# Patient Record
Sex: Male | Born: 1975 | Race: White | Hispanic: No | Marital: Married | Smoking: Current every day smoker
Health system: Southern US, Community
[De-identification: ages and names within clinical notes are randomized; demographics above are authoritative.]

## PROBLEM LIST (undated history)

## (undated) DIAGNOSIS — I639 Cerebral infarction, unspecified: Secondary | ICD-10-CM

## (undated) DIAGNOSIS — M109 Gout, unspecified: Secondary | ICD-10-CM

## (undated) DIAGNOSIS — R609 Edema, unspecified: Secondary | ICD-10-CM

## (undated) DIAGNOSIS — I10 Essential (primary) hypertension: Secondary | ICD-10-CM

## (undated) DIAGNOSIS — G4730 Sleep apnea, unspecified: Secondary | ICD-10-CM

## (undated) HISTORY — PX: CHOLECYSTECTOMY: SHX55

## (undated) HISTORY — PX: HERNIA REPAIR: SHX51

---

## 2003-09-10 ENCOUNTER — Other Ambulatory Visit: Payer: Self-pay

## 2009-03-19 ENCOUNTER — Emergency Department: Payer: Self-pay | Admitting: Emergency Medicine

## 2012-11-10 ENCOUNTER — Emergency Department: Payer: Self-pay | Admitting: Internal Medicine

## 2012-11-10 LAB — ETHANOL
Ethanol %: <0.003 % (ref 0.000–0.080)
Ethanol: <3 mg/dL

## 2012-11-10 LAB — DRUG SCREEN, URINE
Barbiturates, Ur Screen: NEGATIVE (ref ?–200)
Benzodiazepine, Ur Scrn: NEGATIVE (ref ?–200)
Cannabinoid 50 Ng, Ur ~~LOC~~: POSITIVE (ref ?–50)
Cocaine Metabolite,Ur ~~LOC~~: NEGATIVE (ref ?–300)
MDMA (Ecstasy)Ur Screen: NEGATIVE (ref ?–500)
Opiate, Ur Screen: POSITIVE (ref ?–300)
Phencyclidine (PCP) Ur S: NEGATIVE (ref ?–25)
Tricyclic, Ur Screen: NEGATIVE (ref ?–1000)

## 2012-11-10 LAB — COMPREHENSIVE METABOLIC PANEL
Anion Gap: 4 — ABNORMAL LOW (ref 7–16)
BUN: 18 mg/dL (ref 7–18)
Chloride: 106 mmol/L (ref 98–107)
Co2: 28 mmol/L (ref 21–32)
Creatinine: 1.11 mg/dL (ref 0.60–1.30)
EGFR (African American): >60
EGFR (Non-African Amer.): >60
Glucose: 102 mg/dL — ABNORMAL HIGH (ref 65–99)
Potassium: 4.3 mmol/L (ref 3.5–5.1)
SGOT(AST): 31 U/L (ref 15–37)
Sodium: 138 mmol/L (ref 136–145)
Total Protein: 7.6 g/dL (ref 6.4–8.2)

## 2012-11-10 LAB — CBC
MCH: 31.2 pg (ref 26.0–34.0)
MCHC: 34.7 g/dL (ref 32.0–36.0)
MCV: 90 fL (ref 80–100)
Platelet: 172 10*3/uL (ref 150–440)
RBC: 5.37 10*6/uL (ref 4.40–5.90)
RDW: 13 % (ref 11.5–14.5)

## 2012-11-10 LAB — URINALYSIS, COMPLETE
Bacteria: NONE SEEN
Blood: NEGATIVE
Glucose,UR: NEGATIVE mg/dL (ref 0–75)
Nitrite: NEGATIVE
Protein: NEGATIVE
RBC,UR: NONE SEEN /HPF (ref 0–5)

## 2012-11-10 LAB — TSH: Thyroid Stimulating Horm: 3.07 u[IU]/mL

## 2012-11-13 ENCOUNTER — Emergency Department: Payer: Self-pay | Admitting: Emergency Medicine

## 2012-11-13 LAB — COMPREHENSIVE METABOLIC PANEL
Albumin: 4.6 g/dL (ref 3.4–5.0)
Alkaline Phosphatase: 91 U/L (ref 50–136)
Anion Gap: 7 (ref 7–16)
Calcium, Total: 9.6 mg/dL (ref 8.5–10.1)
Co2: 24 mmol/L (ref 21–32)
Creatinine: 1.08 mg/dL (ref 0.60–1.30)
EGFR (African American): >60
EGFR (Non-African Amer.): >60
SGOT(AST): 20 U/L (ref 15–37)
Total Protein: 7.8 g/dL (ref 6.4–8.2)

## 2012-11-13 LAB — URINALYSIS, COMPLETE
Blood: NEGATIVE
Glucose,UR: NEGATIVE mg/dL (ref 0–75)
Leukocyte Esterase: NEGATIVE
Nitrite: NEGATIVE
Protein: 30
Specific Gravity: 1.03 (ref 1.003–1.030)
Squamous Epithelial: <1
WBC UR: 4 /HPF (ref 0–5)

## 2012-11-13 LAB — DRUG SCREEN, URINE
Barbiturates, Ur Screen: NEGATIVE (ref ?–200)
Benzodiazepine, Ur Scrn: NEGATIVE (ref ?–200)
MDMA (Ecstasy)Ur Screen: NEGATIVE (ref ?–500)
Methadone, Ur Screen: NEGATIVE (ref ?–300)
Opiate, Ur Screen: POSITIVE (ref ?–300)

## 2012-11-13 LAB — CBC
HCT: 50.2 % (ref 40.0–52.0)
HGB: 17.3 g/dL (ref 13.0–18.0)
MCV: 89 fL (ref 80–100)
Platelet: 226 10*3/uL (ref 150–440)
WBC: 11.8 10*3/uL — ABNORMAL HIGH (ref 3.8–10.6)

## 2012-11-13 LAB — LIPASE, BLOOD: Lipase: 89 U/L (ref 73–393)

## 2014-01-15 ENCOUNTER — Emergency Department: Payer: Self-pay | Admitting: Emergency Medicine

## 2014-01-15 LAB — CBC WITH DIFFERENTIAL/PLATELET
BASOS ABS: 0.1 10*3/uL (ref 0.0–0.1)
Basophil %: 0.6 %
EOS ABS: 0 10*3/uL (ref 0.0–0.7)
Eosinophil %: 0.1 %
HCT: 49.9 % (ref 40.0–52.0)
HGB: 16.9 g/dL (ref 13.0–18.0)
LYMPHS ABS: 2.3 10*3/uL (ref 1.0–3.6)
LYMPHS PCT: 16.5 %
MCH: 30.6 pg (ref 26.0–34.0)
MCHC: 33.9 g/dL (ref 32.0–36.0)
MCV: 90 fL (ref 80–100)
MONO ABS: 0.7 x10 3/mm (ref 0.2–1.0)
MONOS PCT: 5.2 %
Neutrophil #: 10.8 10*3/uL — ABNORMAL HIGH (ref 1.4–6.5)
Neutrophil %: 77.6 %
Platelet: 251 10*3/uL (ref 150–440)
RBC: 5.53 10*6/uL (ref 4.40–5.90)
RDW: 13.4 % (ref 11.5–14.5)
WBC: 14 10*3/uL — AB (ref 3.8–10.6)

## 2014-01-15 LAB — URINALYSIS, COMPLETE
BILIRUBIN, UR: NEGATIVE
BLOOD: NEGATIVE
GLUCOSE, UR: NEGATIVE mg/dL (ref 0–75)
LEUKOCYTE ESTERASE: NEGATIVE
Nitrite: NEGATIVE
Ph: 7 (ref 4.5–8.0)
Protein: 30
RBC,UR: 4 /HPF (ref 0–5)
Specific Gravity: 1.024 (ref 1.003–1.030)
Squamous Epithelial: NONE SEEN
WBC UR: 3 /HPF (ref 0–5)

## 2014-01-15 LAB — COMPREHENSIVE METABOLIC PANEL
ANION GAP: 6 — AB (ref 7–16)
Albumin: 3.8 g/dL (ref 3.4–5.0)
Alkaline Phosphatase: 82 U/L
BUN: 14 mg/dL (ref 7–18)
Bilirubin,Total: 1.1 mg/dL — ABNORMAL HIGH (ref 0.2–1.0)
CALCIUM: 8.6 mg/dL (ref 8.5–10.1)
CO2: 31 mmol/L (ref 21–32)
Chloride: 103 mmol/L (ref 98–107)
Creatinine: 1.18 mg/dL (ref 0.60–1.30)
EGFR (African American): >60
EGFR (Non-African Amer.): >60
GLUCOSE: 93 mg/dL (ref 65–99)
Osmolality: 280 (ref 275–301)
Potassium: 3.6 mmol/L (ref 3.5–5.1)
SGOT(AST): 16 U/L (ref 15–37)
SGPT (ALT): 18 U/L
Sodium: 140 mmol/L (ref 136–145)
Total Protein: 7.5 g/dL (ref 6.4–8.2)

## 2014-01-15 LAB — TROPONIN I: Troponin-I: <0.02 ng/mL

## 2014-01-15 LAB — LIPASE, BLOOD: Lipase: 103 U/L (ref 73–393)

## 2014-01-24 ENCOUNTER — Observation Stay: Payer: Self-pay | Admitting: Surgery

## 2014-01-24 LAB — URINALYSIS, COMPLETE
Bilirubin,UR: NEGATIVE
Blood: NEGATIVE
Glucose,UR: NEGATIVE mg/dL (ref 0–75)
LEUKOCYTE ESTERASE: NEGATIVE
Nitrite: NEGATIVE
Ph: 6 (ref 4.5–8.0)
Protein: 30
Specific Gravity: 1.026 (ref 1.003–1.030)

## 2014-01-24 LAB — COMPREHENSIVE METABOLIC PANEL
ALT: 22 U/L
Albumin: 4 g/dL (ref 3.4–5.0)
Alkaline Phosphatase: 85 U/L
Anion Gap: 10 (ref 7–16)
BILIRUBIN TOTAL: 0.8 mg/dL (ref 0.2–1.0)
BUN: 10 mg/dL (ref 7–18)
CALCIUM: 9 mg/dL (ref 8.5–10.1)
CHLORIDE: 106 mmol/L (ref 98–107)
CREATININE: 1.04 mg/dL (ref 0.60–1.30)
Co2: 25 mmol/L (ref 21–32)
EGFR (African American): >60
Glucose: 90 mg/dL (ref 65–99)
Osmolality: 280 (ref 275–301)
Potassium: 3.6 mmol/L (ref 3.5–5.1)
SGOT(AST): 21 U/L (ref 15–37)
Sodium: 141 mmol/L (ref 136–145)
TOTAL PROTEIN: 7.7 g/dL (ref 6.4–8.2)

## 2014-01-24 LAB — CBC WITH DIFFERENTIAL/PLATELET
BASOS ABS: 0.1 10*3/uL (ref 0.0–0.1)
Basophil %: 0.6 %
Eosinophil #: 0.1 10*3/uL (ref 0.0–0.7)
Eosinophil %: 0.5 %
HCT: 52.8 % — ABNORMAL HIGH (ref 40.0–52.0)
HGB: 17.9 g/dL (ref 13.0–18.0)
LYMPHS ABS: 2 10*3/uL (ref 1.0–3.6)
Lymphocyte %: 16.8 %
MCH: 30.8 pg (ref 26.0–34.0)
MCHC: 33.9 g/dL (ref 32.0–36.0)
MCV: 91 fL (ref 80–100)
MONO ABS: 0.5 x10 3/mm (ref 0.2–1.0)
Monocyte %: 4.1 %
NEUTROS PCT: 78 %
Neutrophil #: 9.3 10*3/uL — ABNORMAL HIGH (ref 1.4–6.5)
PLATELETS: 258 10*3/uL (ref 150–440)
RBC: 5.81 10*6/uL (ref 4.40–5.90)
RDW: 13.4 % (ref 11.5–14.5)
WBC: 11.9 10*3/uL — AB (ref 3.8–10.6)

## 2014-01-24 LAB — TROPONIN I: Troponin-I: <0.02 ng/mL

## 2014-01-24 LAB — LIPASE, BLOOD: LIPASE: 116 U/L (ref 73–393)

## 2014-02-27 ENCOUNTER — Emergency Department: Payer: Self-pay | Admitting: Student

## 2014-02-27 LAB — COMPREHENSIVE METABOLIC PANEL
ALK PHOS: 95 U/L
ANION GAP: 9 (ref 7–16)
Albumin: 3.7 g/dL (ref 3.4–5.0)
BILIRUBIN TOTAL: 0.9 mg/dL (ref 0.2–1.0)
BUN: 14 mg/dL (ref 7–18)
CALCIUM: 8.9 mg/dL (ref 8.5–10.1)
CO2: 26 mmol/L (ref 21–32)
Chloride: 105 mmol/L (ref 98–107)
Creatinine: 1.03 mg/dL (ref 0.60–1.30)
EGFR (Non-African Amer.): >60
Glucose: 122 mg/dL — ABNORMAL HIGH (ref 65–99)
Osmolality: 281 (ref 275–301)
Potassium: 3.5 mmol/L (ref 3.5–5.1)
SGOT(AST): 23 U/L (ref 15–37)
SGPT (ALT): 19 U/L
Sodium: 140 mmol/L (ref 136–145)
Total Protein: 7.4 g/dL (ref 6.4–8.2)

## 2014-02-27 LAB — URINALYSIS, COMPLETE
Bacteria: NONE SEEN
Bilirubin,UR: NEGATIVE
Blood: NEGATIVE
GLUCOSE, UR: NEGATIVE mg/dL (ref 0–75)
KETONE: NEGATIVE
Leukocyte Esterase: NEGATIVE
Nitrite: NEGATIVE
Ph: 6 (ref 4.5–8.0)
RBC,UR: <1 /HPF (ref 0–5)
Specific Gravity: 1.028 (ref 1.003–1.030)

## 2014-02-27 LAB — CBC
HCT: 48.2 % (ref 40.0–52.0)
HGB: 16.4 g/dL (ref 13.0–18.0)
MCH: 31.1 pg (ref 26.0–34.0)
MCHC: 34 g/dL (ref 32.0–36.0)
MCV: 91 fL (ref 80–100)
Platelet: 224 10*3/uL (ref 150–440)
RBC: 5.27 10*6/uL (ref 4.40–5.90)
RDW: 13.9 % (ref 11.5–14.5)
WBC: 10.7 10*3/uL — ABNORMAL HIGH (ref 3.8–10.6)

## 2014-02-27 LAB — TROPONIN I

## 2014-02-27 LAB — LIPASE, BLOOD: LIPASE: 57 U/L — AB (ref 73–393)

## 2014-05-27 ENCOUNTER — Emergency Department: Payer: Self-pay | Admitting: Emergency Medicine

## 2014-05-27 LAB — CBC WITH DIFFERENTIAL/PLATELET
Basophil #: 0 10*3/uL (ref 0.0–0.1)
Basophil %: 0.3 %
Eosinophil #: 0 10*3/uL (ref 0.0–0.7)
Eosinophil %: 0 %
HCT: 52.3 % — ABNORMAL HIGH (ref 40.0–52.0)
HGB: 17.5 g/dL (ref 13.0–18.0)
LYMPHS PCT: 6.6 %
Lymphocyte #: 0.9 10*3/uL — ABNORMAL LOW (ref 1.0–3.6)
MCH: 30.5 pg (ref 26.0–34.0)
MCHC: 33.5 g/dL (ref 32.0–36.0)
MCV: 91 fL (ref 80–100)
Monocyte #: 0.3 x10 3/mm (ref 0.2–1.0)
Monocyte %: 1.8 %
Neutrophil #: 13.2 10*3/uL — ABNORMAL HIGH (ref 1.4–6.5)
Neutrophil %: 91.3 %
Platelet: 270 10*3/uL (ref 150–440)
RBC: 5.75 10*6/uL (ref 4.40–5.90)
RDW: 12.9 % (ref 11.5–14.5)
WBC: 14.4 10*3/uL — ABNORMAL HIGH (ref 3.8–10.6)

## 2014-05-27 LAB — URINALYSIS, COMPLETE
BACTERIA: NONE SEEN
BILIRUBIN, UR: NEGATIVE
Blood: NEGATIVE
Glucose,UR: NEGATIVE mg/dL (ref 0–75)
LEUKOCYTE ESTERASE: NEGATIVE
Nitrite: NEGATIVE
PH: 9 (ref 4.5–8.0)
Protein: 100
SQUAMOUS EPITHELIAL: NONE SEEN
Specific Gravity: 1.026 (ref 1.003–1.030)
WBC UR: 2 /HPF (ref 0–5)

## 2014-05-27 LAB — BASIC METABOLIC PANEL
ANION GAP: 9 (ref 7–16)
BUN: 15 mg/dL
CHLORIDE: 104 mmol/L
Calcium, Total: 9.7 mg/dL
Co2: 25 mmol/L
Creatinine: 0.96 mg/dL
EGFR (Non-African Amer.): >60
Glucose: 133 mg/dL — ABNORMAL HIGH
POTASSIUM: 3.9 mmol/L
SODIUM: 138 mmol/L

## 2014-05-27 LAB — LIPASE, BLOOD: Lipase: 18 U/L — ABNORMAL LOW

## 2014-06-24 NOTE — H&P (Signed)
History of Present Illness 39 yom who has had intermittent RUQ pain for over a year, but this has been getting progressively worse over the last few weeks. His pain typically lasts 1/2 - 2 hours, but lately his pain has lasted all day. He is always nauseous and usually vomits, as he has done today. No fever, chills, or jaundice Sx.   Past History MO   Past Med/Surgical Hx:  Gallstones:   HTN:   Denies medical history:   Hernia Repair:   ALLERGIES:  No Known Allergies:   HOME MEDICATIONS: Medication Instructions Status  promethazine 25 mg oral tablet 1 tab(s) orally every 6 hours, As Needed - for Nausea, Vomiting Active   Family and Social History:  Social History refer to my outpatient H & P   Review of Systems:  Fever/Chills No   Cough No   Sputum No   Abdominal Pain Yes   Diarrhea No   Constipation No   Nausea/Vomiting Yes   SOB/DOE No   Chest Pain No   Dysuria No   Tolerating PT Yes   Tolerating Diet No  Nauseated  Vomiting   Physical Exam:  GEN well developed, well nourished, no acute distress, obese   HEENT pink conjunctivae, PERRL, hearing intact to voice, moist oral mucosa, Oropharynx clear   NECK supple  trachea midline   RESP normal resp effort  clear BS  no use of accessory muscles   CARD regular rate  no murmur  no JVD  no Rub   ABD denies tenderness  obese   EXTR negative cyanosis/clubbing, negative edema   SKIN normal to palpation, No rashes, skin turgor good   NEURO cranial nerves intact, negative tremor, follows commands, motor/sensory function intact   PSYCH alert, A+O to time, place, person, poor insight   Lab Results: Hepatic:  24-Nov-15 20:16   Bilirubin, Total 0.8  Alkaline Phosphatase 85 (46-116 NOTE: New Reference Range 09/20/13)  SGPT (ALT) 22 (14-63 NOTE: New Reference Range 09/20/13)  SGOT (AST) 21  Total Protein, Serum 7.7  Albumin, Serum 4.0  Routine Chem:  24-Nov-15 20:16   Glucose, Serum 90  BUN 10   Creatinine (comp) 1.04  Sodium, Serum 141  Potassium, Serum 3.6  Chloride, Serum 106  CO2, Serum 25  Calcium (Total), Serum 9.0  Osmolality (calc) 280  eGFR (African American) >60  eGFR (Non-African American) >60 (eGFR values <66m/min/1.73 m2 may be an indication of chronic kidney disease (CKD). Calculated eGFR, using the MRDR Study equation, is useful in  patients with stable renal function. The eGFR calculation will not be reliable in acutely ill patients when serum creatinine is changing rapidly. It is not useful in patients on dialysis. The eGFR calculation may not be applicable to patients at the low and high extremes of body sizes, pregnant women, and vegetarians.)  Anion Gap 10  Lipase 116 (Result(s) reported on 24 Jan 2014 at 09:35PM.)  Cardiac:  24-Nov-15 20:16   Troponin I < 0.02 (0.00-0.05 0.05 ng/mL or less: NEGATIVE  Repeat testing in 3-6 hrs  if clinically indicated. >0.05 ng/mL: POTENTIAL  MYOCARDIAL INJURY. Repeat  testing in 3-6 hrs if  clinically indicated. NOTE: An increase or decrease  of 30% or more on serial  testing suggests a  clinically important change)  Routine UA:  24-Nov-15 20:16   Color (UA) Yellow  Clarity (UA) Clear  Glucose (UA) Negative  Bilirubin (UA) Negative  Ketones (UA) 1+  Specific Gravity (UA) 1.026  Blood (UA) Negative  pH (UA) 6.0  Protein (UA) 30 mg/dL  Nitrite (UA) Negative  Leukocyte Esterase (UA) Negative (Result(s) reported on 24 Jan 2014 at 09:35PM.)  RBC (UA) 3 /HPF  WBC (UA) 1 /HPF  Bacteria (UA) TRACE  Epithelial Cells (UA) <1 /HPF  Mucous (UA) PRESENT (Result(s) reported on 24 Jan 2014 at 09:35PM.)  Routine Hem:  24-Nov-15 20:16   WBC (CBC)  11.9  RBC (CBC) 5.81  Hemoglobin (CBC) 17.9  Hematocrit (CBC)  52.8  Platelet Count (CBC) 258  MCV 91  MCH 30.8  MCHC 33.9  RDW 13.4  Neutrophil % 78.0  Lymphocyte % 16.8  Monocyte % 4.1  Eosinophil % 0.5  Basophil % 0.6  Neutrophil #  9.3  Lymphocyte # 2.0   Monocyte # 0.5  Eosinophil # 0.1  Basophil # 0.1 (Result(s) reported on 24 Jan 2014 at 09:33PM.)   Radiology Results: [place]:    15-Nov-15 23:44, US Abdomen Limited Survey  US Abdomen Limited Survey  REASON FOR EXAM:    Abdominal Pain, N/V  COMMENTS:   Body Site: Gallbladder, Liver, Common Bile Duct    PROCEDURE: [place]  - US ABDOMEN LIMITED SURVEY  - Jan 15 2014 11:44PM     CLINICAL DATA:  Abdominal pain, nausea and vomiting, history of  cholelithiasis    EXAM:  US ABDOMEN LIMITED - RIGHT UPPER QUADRANT    COMPARISON:  None.    FINDINGS:  Gallbladder:  Gallbladder is contracted with multiple gallstones within. No  pericholecystic fluid is seen.    Common bile duct:    Diameter: 6 mm.  This is within normal limits    Liver:    No focal lesion identified. Within normal limits in parenchymal  echogenicity.     IMPRESSION:  Cholelithiasis without complicating factors.  Electronically Signed    By: Inez Catalina M.D.    On: 01/15/2014 23:48         Verified By: Everlene Farrier, M.D.,  LabUnknown:  PACS Image    Assessment/Admission Diagnosis Sx cholelithiasis, possible early acute cholecystitis   Plan Lap CCY in AM Pt understands 1/200 risk of CBD injury an dwishes to proceed.   Electronic Signatures: Consuela Mimes (MD)  (Signed [phone] 21:58)  Authored: CHIEF COMPLAINT and HISTORY, PAST MEDICAL/SURGIAL HISTORY, ALLERGIES, HOME MEDICATIONS, FAMILY AND SOCIAL HISTORY, REVIEW OF SYSTEMS, PHYSICAL EXAM, LABS, Radiology, ASSESSMENT AND PLAN   Last Updated: 24-Nov-15 21:58 by Consuela Mimes (MD)

## 2014-06-24 NOTE — Op Note (Signed)
PATIENT NAME:  Joseph Waters, Joseph Waters MR#:  [id number] DATE OF BIRTH:  10-Sep-1975  DATE OF PROCEDURE:  01/25/2014  PREOPERATIVE DIAGNOSIS:  Symptomatic cholelithiasis.   POSTOPERATIVE DIAGNOSIS:  Symptomatic cholelithiasis.   PROCEDURE PERFORMED: Laparoscopic cholecystectomy.   ANESTHESIA: General.   ESTIMATED BLOOD LOSS: 10 mL.   COMPLICATIONS: None.   SPECIMENS: Gallbladder.   INDICATION FOR SURGERY: [name] is a pleasant 39 year old who presents with recurrent right upper quadrant pain which had acutely worsened prior to when he was supposed to have a laparoscopic cholecystectomy. He was noted to have gallstones and pain was consistent with biliary disease. He was thus brought to the operating room suite for laparoscopic cholecystectomy.   DETAILS OF PROCEDURE:  Informed consent was obtained. [name] was brought to the operating room suite. He was induced. Endotracheal tube was placed. General anesthesia was administered. His abdomen was prepped and draped in standard surgical fashion. A timeout was then performed correctly identifying patient name, operative site, and procedure to perform. A supraumbilical incision was made, it was deepened down to fascia. The fascia was incised. Peritoneum was entered. Two stay stitches were placed through the fasciotomy. A Hassan trocar was placed in the abdomen. The abdomen was insufflated.  An 11 mm scope was placed in the abdomen. An epigastric 11 mm and two 5 mm right subcostal trocars were placed in the midclavicular and anterior axillary line. The gallbladder was then lifted over the dome of the liver, peritoneum was taken down over the gallbladder. Cystic duct and cystic artery were dissected out. Critical view was obtained. These structures were clipped and ligated. The gallbladder was then taken off the gallbladder fossa with cautery and brought through an EndoCatch bag. The abdomen was irrigated. Hemostasis was obtained and when happy with hemostasis  all trocars were removed. Abdomen was desufflated. The supraumbilical fascia was closed with figure-of-eight 0 Vicryl. The skin was then closed with interrupted 4-0 Monocryl deep dermal sutures. Steri-Strips, Telfa, gauze, and Tegaderm were used to complete the dressing. The patient was then awoken, extubated, and brought to the postanesthesia care unit. There were no immediate complications. Needle, sponge, and instrument counts were correct at the end of the procedure.    ____________________________ Si Raiderhristopher A. Clifford Coudriet, MD cal:bu D: 01/25/2014 14:00:00 ET T: 01/25/2014 17:52:20 ET JOB#: [id number]  cc: Cristal Deerhristopher A. Thomasina Housley, MD, <Dictator> Jarvis NewcomerHRISTOPHER A Camay Pedigo MD ELECTRONICALLY SIGNED 02/06/2014 20:08

## 2014-06-26 LAB — SURGICAL PATHOLOGY

## 2014-12-13 ENCOUNTER — Encounter: Payer: Self-pay | Admitting: Emergency Medicine

## 2014-12-13 ENCOUNTER — Emergency Department
Admission: EM | Admit: 2014-12-13 | Discharge: 2014-12-13 | Disposition: A | Discharge status: Home / Self Care | Payer: Self-pay | Attending: Emergency Medicine | Admitting: Emergency Medicine

## 2014-12-13 DIAGNOSIS — R109 Unspecified abdominal pain: Secondary | ICD-10-CM

## 2014-12-13 DIAGNOSIS — Z720 Tobacco use: Secondary | ICD-10-CM | POA: Insufficient documentation

## 2014-12-13 DIAGNOSIS — K529 Noninfective gastroenteritis and colitis, unspecified: Secondary | ICD-10-CM | POA: Insufficient documentation

## 2014-12-13 LAB — COMPREHENSIVE METABOLIC PANEL
ALT: 16 U/L — ABNORMAL LOW (ref 17–63)
AST: 19 U/L (ref 15–41)
Albumin: 4.1 g/dL (ref 3.5–5.0)
Alkaline Phosphatase: 66 U/L (ref 38–126)
Anion gap: 7 (ref 5–15)
BUN: 18 mg/dL (ref 6–20)
CHLORIDE: 106 mmol/L (ref 101–111)
CO2: 28 mmol/L (ref 22–32)
Calcium: 8.7 mg/dL — ABNORMAL LOW (ref 8.9–10.3)
Creatinine, Ser: 1.06 mg/dL (ref 0.61–1.24)
GFR calc Af Amer: >60 mL/min (ref >60)
GFR calc non Af Amer: >60 mL/min (ref >60)
Glucose, Bld: 111 mg/dL — ABNORMAL HIGH (ref 65–99)
POTASSIUM: 4.1 mmol/L (ref 3.5–5.1)
SODIUM: 141 mmol/L (ref 135–145)
Total Bilirubin: 1 mg/dL (ref 0.3–1.2)
Total Protein: 7.1 g/dL (ref 6.5–8.1)

## 2014-12-13 LAB — CBC WITH DIFFERENTIAL/PLATELET
Basophils Absolute: 0 10*3/uL (ref 0–0.1)
Basophils Relative: 1 %
EOS ABS: 0.2 10*3/uL (ref 0–0.7)
Eosinophils Relative: 2 %
HEMATOCRIT: 48.8 % (ref 40.0–52.0)
HEMOGLOBIN: 16.3 g/dL (ref 13.0–18.0)
Lymphocytes Relative: 16 %
Lymphs Abs: 1.7 10*3/uL (ref 1.0–3.6)
MCH: 31.5 pg (ref 26.0–34.0)
MCHC: 33.5 g/dL (ref 32.0–36.0)
MCV: 94.1 fL (ref 80.0–100.0)
MONO ABS: 0.6 10*3/uL (ref 0.2–1.0)
MONOS PCT: 5 %
NEUTROS ABS: 8 10*3/uL — AB (ref 1.4–6.5)
NEUTROS PCT: 76 %
Platelets: 200 10*3/uL (ref 150–440)
RBC: 5.19 MIL/uL (ref 4.40–5.90)
RDW: 12.7 % (ref 11.5–14.5)
WBC: 10.5 10*3/uL (ref 3.8–10.6)

## 2014-12-13 LAB — LIPASE, BLOOD: Lipase: 18 U/L — ABNORMAL LOW (ref 22–51)

## 2014-12-13 LAB — ETHANOL: Alcohol, Ethyl (B): <5 mg/dL (ref <5)

## 2014-12-13 MED ORDER — ONDANSETRON HCL 4 MG PO TABS: 4.0000 mg | ORAL_TABLET | Freq: Three times a day (TID) | ORAL | Status: DC | PRN

## 2014-12-13 MED ORDER — GI COCKTAIL ~~LOC~~
30.0000 mL | Freq: Once | ORAL | Status: AC
  Administered 2014-12-13: 30 mL via ORAL
  Filled 2014-12-13: qty 30

## 2014-12-13 MED ORDER — SODIUM CHLORIDE 0.9 % IV BOLUS (SEPSIS)
1000.0000 mL | Freq: Once | INTRAVENOUS | Status: AC
  Administered 2014-12-13: 1000 mL via INTRAVENOUS

## 2014-12-13 MED ORDER — DICYCLOMINE HCL 10 MG/ML IM SOLN
20.0000 mg | Freq: Once | INTRAMUSCULAR | Status: AC
  Administered 2014-12-13: 20 mg via INTRAMUSCULAR
  Filled 2014-12-13: qty 2

## 2014-12-13 MED ORDER — ONDANSETRON HCL 4 MG/2ML IJ SOLN
4.0000 mg | Freq: Once | INTRAMUSCULAR | Status: AC
  Administered 2014-12-13: 4 mg via INTRAVENOUS
  Filled 2014-12-13: qty 2

## 2014-12-13 NOTE — ED Notes (Signed)
Patient denies taking any medications since onset of symptoms.

## 2014-12-13 NOTE — ED Notes (Signed)
Says pain in upper abd since yest am.  Nausea and vomited couple times.  crampy

## 2014-12-13 NOTE — ED Notes (Signed)
Patient given saltine crackers and ginger ale for PO challenge.  Patient tolerated with no complaints of nausea or vomiting.

## 2014-12-13 NOTE — ED Provider Notes (Addendum)
[facility] Emergency Department Provider Note  ____________________________________________   I have reviewed the triage vital signs and the nursing notes.   HISTORY  Chief Complaint Abdominal Pain    HPI Joseph Waters is a 39 y.o. male with a history of cholecystectomy about a year ago, otherwise fairly healthy, does drink 1-2 beers most nights, presents today complaining of epigastric pain. Patient states that he had vomiting yesterday morning and multiple episodes of watery diarrhea and since that time he has had ongoing diarrhea and cramping abdominal pain especially epigastric region. He has had no fever no chills. He has not vomited since yesterday morning. He has not had by mouth this morning. However he did have some soup yesterday. Food seemed to make the pain worse. He denied any melena, bright red blood per rectum or hematemesis. He states that the pain as a crampy discomfort. It is sharp, comes and goes. The patient states that he has had no lower abdominal discomfort. No recent travel no recent antibiotics or recent camping  History reviewed. No pertinent past medical history.  There are no active problems to display for this patient.   No past surgical history on file.  No current outpatient prescriptions on file.  Allergies Review of patient's allergies indicates no known allergies.  No family history on file.  Social History Social History  Substance Use Topics  . Smoking status: Current Every Day Smoker  . Smokeless tobacco: None  . Alcohol Use: Yes    Review of Systems Constitutional: No fever/chills Eyes: No visual changes. ENT: No sore throat. No stiff neck no neck pain Cardiovascular: Denies chest pain. Respiratory: Denies shortness of breath. Gastrointestinal: See history of present illness Genitourinary: Negative for dysuria. Musculoskeletal: Negative lower extremity swelling Skin: Negative for rash. Neurological:  Negative for headaches, focal weakness or numbness. 10-point ROS otherwise negative.  ____________________________________________   PHYSICAL EXAM:  VITAL SIGNS: ED Triage Vitals  Enc Vitals Group     BP 12/13/14 0749 161/89 mmHg     Pulse Rate 12/13/14 0749 84     Resp 12/13/14 0749 16     Temp 12/13/14 0749 97.3 F (36.3 C)     Temp Source 12/13/14 0749 Oral     SpO2 12/13/14 0749 99 %     Weight --      Height --      Head Cir --      Peak Flow --      Pain Score 12/13/14 0744 0     Pain Loc --      Pain Edu? --      Excl. in GC? --     Constitutional: Alert and oriented. Well appearing and in no acute distress. Eyes: Conjunctivae are normal. PERRL. EOMI. Head: Atraumatic. Nose: No congestion/rhinnorhea. Mouth/Throat: Mucous membranes are moist.  Oropharynx non-erythematous. Neck: No stridor.   Nontender with no meningismus Cardiovascular: Normal rate, regular rhythm. Grossly normal heart sounds.  Good peripheral circulation. Respiratory: Normal respiratory effort.  No retractions. Lungs CTAB. Gastrointestinal: Patient is morbidly obese. There is minimal epigastric discomfort with no guarding or rebound, there is no right upper quadrant right lower quadrant and left lower quadrant or other discomfort noted. Abdomen is soft with good bowel sounds Back:  There is no focal tenderness or step off there is no midline tenderness there are no lesions noted. there is no CVA tenderness Musculoskeletal: No lower extremity tenderness. No joint effusions, no DVT signs strong distal pulses no edema Neurologic:  Normal speech and language. No gross focal neurologic deficits are appreciated.  Skin:  Skin is warm, dry and intact. No rash noted. Psychiatric: Mood and affect are normal. Speech and behavior are normal.  ____________________________________________   LABS (all labs ordered are listed, but only abnormal results are displayed)  Labs Reviewed  COMPREHENSIVE METABOLIC  PANEL  ETHANOL  CBC WITH DIFFERENTIAL/PLATELET  LIPASE, BLOOD   ____________________________________________  EKG   ____________________________________________  RADIOLOGY   ____________________________________________   PROCEDURES  Procedure(s) performed: None  Critical Care performed: None  ____________________________________________   INITIAL IMPRESSION / ASSESSMENT AND PLAN / ED COURSE  Pertinent labs & imaging results that were available during my care of the patient were reviewed by me and considered in my medical decision making (see chart for details).  Patient presents today complaining of epigastric discomfort, nausea vomiting diarrhea. He does not have a gallbladder. He is very well-appearing. Does not appear dehydrated. No evidence of GI bleed on exam. Vital signs are reassuring. Do not feel this is referred cardiac pain do not feel this is likely a AAA do not feel this is ischemia. Patient's reproducible abdominal pain in the context of nausea vomiting diarrhea. We'll give him IV fluids check for pancreatitis and serial abdominal exams while treating his nausea was pain and reassess. ____________________________________________   ----------------------------------------- 9:17 AM on 12/13/2014 -----------------------------------------  Patient states the GI cocktail helped him but he still is having waves of cramping pain every once in a while. We will continue with hydration, blood work as expected is very reassuring, vital signs are reassuring, serial abdominal exams betray no evidence at this time of any surgical pathology.  ----------------------------------------- 10:13 AM on 12/13/2014 -----------------------------------------  Patient remains at this time pain-free, we'll continue hydration and reassess, serial abdominal exams betray no significant abdominal tenderness specifically no right upper quadrant tenderness no right lower quadrant tenderness.  Strong distal pulses again nothing to suggest AAA, patient would prefer to avoid imaging at this point I think that is not unreasonable.  ----------------------------------------- 10:57 AM on 12/13/2014 -----------------------------------------  Patient remains in no acute distress he states his pain is nearly gone, serial abdominal exams are unremarkable is no evidence of obstruction or internal hernia AAA appendicitis diverticulitis or other acute pathology today. However, extensive return precautions have been given and understood by the patient. The patient continues to decline imaging which I do not think is unreasonable. There is no evidence of acute pathology today. Blood work and vital signs are reassuring. Patient is been able to eat and drink and has had no vomiting or he has been here.  FINAL CLINICAL IMPRESSION(S) / ED DIAGNOSES  Final diagnoses:  None     Jeanmarie PlantJames A Taveon Enyeart, MD 12/13/14 [id number]  Jeanmarie PlantJames A Jenisse Vullo, MD 12/13/14 1013  Jeanmarie PlantJames A Jabreel Chimento, MD 12/13/14 1057

## 2014-12-13 NOTE — Discharge Instructions (Signed)

## 2015-08-29 ENCOUNTER — Emergency Department
Admission: EM | Admit: 2015-08-29 | Discharge: 2015-08-29 | Disposition: A | Discharge status: Home / Self Care | Payer: Self-pay | Attending: Student | Admitting: Student

## 2015-08-29 ENCOUNTER — Emergency Department: Payer: Self-pay

## 2015-08-29 DIAGNOSIS — M1711 Unilateral primary osteoarthritis, right knee: Secondary | ICD-10-CM | POA: Insufficient documentation

## 2015-08-29 DIAGNOSIS — F17200 Nicotine dependence, unspecified, uncomplicated: Secondary | ICD-10-CM | POA: Insufficient documentation

## 2015-08-29 DIAGNOSIS — I10 Essential (primary) hypertension: Secondary | ICD-10-CM | POA: Insufficient documentation

## 2015-08-29 DIAGNOSIS — Z79899 Other long term (current) drug therapy: Secondary | ICD-10-CM | POA: Insufficient documentation

## 2015-08-29 HISTORY — DX: Essential (primary) hypertension: I10

## 2015-08-29 MED ORDER — IBUPROFEN 800 MG PO TABS: 800.0000 mg | ORAL_TABLET | Freq: Three times a day (TID) | ORAL | Status: DC | PRN

## 2015-08-29 MED ORDER — PREDNISONE 10 MG PO TABS: 50.0000 mg | ORAL_TABLET | Freq: Every day | ORAL | Status: DC

## 2015-08-29 NOTE — Discharge Instructions (Signed)
Heat Therapy Heat therapy can help ease sore, stiff, injured, and tight muscles and joints. Heat relaxes your muscles, which may help ease your pain.  RISKS AND COMPLICATIONS If you have any of the following conditions, do not use heat therapy unless your health care provider has approved:  Poor circulation.  Healing wounds or scarred skin in the area being treated.  Diabetes, heart disease, or high blood pressure.  Not being able to feel (numbness) the area being treated.  Unusual swelling of the area being treated.  Active infections.  Blood clots.  Cancer.  Inability to communicate pain. This may include young children and people who have problems with their brain function (dementia).  Pregnancy. Heat therapy should only be used on old, pre-existing, or long-lasting (chronic) injuries. Do not use heat therapy on new injuries unless directed by your health care provider. HOW TO USE HEAT THERAPY There are several different kinds of heat therapy, including:  Moist heat pack.  Warm water bath.  Hot water bottle.  Electric heating pad.  Heated gel pack.  Heated wrap.  Electric heating pad. Use the heat therapy method suggested by your health care provider. Follow your health care provider's instructions on when and how to use heat therapy. GENERAL HEAT THERAPY RECOMMENDATIONS  Do not sleep while using heat therapy. Only use heat therapy while you are awake.  Your skin may turn pink while using heat therapy. Do not use heat therapy if your skin turns red.  Do not use heat therapy if you have new pain.  High heat or long exposure to heat can cause burns. Be careful when using heat therapy to avoid burning your skin.  Do not use heat therapy on areas of your skin that are already irritated, such as with a rash or sunburn. SEEK MEDICAL CARE IF:  You have blisters, redness, swelling, or numbness.  You have new pain.  Your pain is worse. MAKE SURE  YOU:  Understand these instructions.  Will watch your condition.  Will get help right away if you are not doing well or get worse.   This information is not intended to replace advice given to you by your health care provider. Make sure you discuss any questions you have with your health care provider.   Document Released: 05/12/2011 Document Revised: 03/10/2014 Document Reviewed: 04/12/2013 Elsevier Interactive Patient Education 2016 [employer]  Arthritis Arthritis is a term that is commonly used to refer to joint pain or joint disease. There are more than 100 types of arthritis. CAUSES The most common cause of this condition is wear and tear of a joint. Other causes include:  Gout.  Inflammation of a joint.  An infection of a joint.  Sprains and other injuries near the joint.  A drug reaction or allergic reaction. In some cases, the cause may not be known. SYMPTOMS The main symptom of this condition is pain in the joint with movement. Other symptoms include:  Redness, swelling, or stiffness at a joint.  Warmth coming from the joint.  Fever.  Overall feeling of illness. DIAGNOSIS This condition may be diagnosed with a physical exam and tests, including:  Blood tests.  Urine tests.  Imaging tests, such as MRI, X-rays, or a CT scan. Sometimes, fluid is removed from a joint for testing. TREATMENT Treatment for this condition may involve:  Treatment of the cause, if it is known.  Rest.  Raising (elevating) the joint.  Applying cold or hot packs to the joint.  Medicines  to improve symptoms and reduce inflammation.  Injections of a steroid such as cortisone into the joint to help reduce pain and inflammation. Depending on the cause of your arthritis, you may need to make lifestyle changes to reduce stress on your joint. These changes may include exercising more and losing weight. HOME CARE INSTRUCTIONS Medicines  Take over-the-counter and prescription  medicines only as told by your health care provider.  Do not take aspirin to relieve pain if gout is suspected. Activities  Rest your joint if told by your health care provider. Rest is important when your disease is active and your joint feels painful, swollen, or stiff.  Avoid activities that make the pain worse. It is important to balance activity with rest.  Exercise your joint regularly with range-of-motion exercises as told by your health care provider. Try doing low-impact exercise, such as:  Swimming.  Water aerobics.  Biking.  Walking. Joint Care  If your joint is swollen, keep it elevated if told by your health care provider.  If your joint feels stiff in the morning, try taking a warm shower.  If directed, apply heat to the joint. If you have diabetes, do not apply heat without permission from your health care provider.  Put a towel between the joint and the hot pack or heating pad.  Leave the heat on the area for 20-30 minutes.  If directed, apply ice to the joint:  Put ice in a plastic bag.  Place a towel between your skin and the bag.  Leave the ice on for 20 minutes, 2-3 times per day.  Keep all follow-up visits as told by your health care provider. This is important. SEEK MEDICAL CARE IF:  The pain gets worse.  You have a fever. SEEK IMMEDIATE MEDICAL CARE IF:  You develop severe joint pain, swelling, or redness.  Many joints become painful and swollen.  You develop severe back pain.  You develop severe weakness in your leg.  You cannot control your bladder or bowels.   This information is not intended to replace advice given to you by your health care provider. Make sure you discuss any questions you have with your health care provider.   Document Released: 03/27/2004 Document Revised: 11/08/2014 Document Reviewed: 05/15/2014 Elsevier Interactive Patient Education [employer] Elsevier Inc.

## 2015-08-29 NOTE — ED Notes (Signed)
Pt c/o right knee pain for >1 month.. States it will swell intermittently.

## 2015-08-29 NOTE — ED Provider Notes (Signed)
[facility] Emergency Department Provider Note  ____________________________________________  Time seen: Approximately 10:54 AM  I have reviewed the triage vital signs and the nursing notes.   HISTORY  Chief Complaint Knee Pain    HPI Joseph PigeonCharlie Szalkowski is a 40 y.o. male who presents for evaluation of right knee pain for greater than 1 month. Patient states that intermittently it was swollen a burning sensation on his inner thigh. Denies any trauma. 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 Describes pain as 8/10. No relief with over-the-counter medications.   Past Medical History  Diagnosis Date  . Hypertension     There are no active problems to display for this patient.   History reviewed. No pertinent past surgical history.  Current Outpatient Rx  Name  Route  Sig  Dispense  Refill  . ibuprofen (ADVIL,MOTRIN) 800 MG tablet   Oral   Take 1 tablet (800 mg total) by mouth every 8 (eight) hours as needed.   30 tablet   0   . ondansetron (ZOFRAN) 4 MG tablet   Oral   Take 1 tablet (4 mg total) by mouth every 8 (eight) hours as needed for nausea or vomiting.   8 tablet   0   . predniSONE (DELTASONE) 10 MG tablet   Oral   Take 5 tablets (50 mg total) by mouth daily with breakfast.   25 tablet   0     Allergies Review of patient's allergies indicates no known allergies.  No family history on file.  Social History Social History  Substance Use Topics  . Smoking status: Current Every Day Smoker  . Smokeless tobacco: None  . Alcohol Use: Yes    Review of Systems Constitutional: No fever/chills Cardiovascular: Denies chest pain. Respiratory: Denies shortness of breath. Musculoskeletal: Positive for right knee pain. Skin: Negative for rash. Neurological: Negative for headaches, focal weakness or numbness.  10-point ROS otherwise negative.  ____________________________________________   PHYSICAL EXAM:  VITAL  SIGNS: ED Triage Vitals  Enc Vitals Group     BP 08/29/15 1044 169/108 mmHg     Pulse Rate 08/29/15 1044 80     Resp 08/29/15 1044 18     Temp 08/29/15 1044 98.2 F (36.8 C)     Temp Source 08/29/15 1044 Oral     SpO2 08/29/15 1044 99 %     Weight 08/29/15 1044 400 lb (181.439 kg)     Height 08/29/15 1044 6' (1.829 m)     Head Cir --      Peak Flow --      Pain Score --      Pain Loc --      Pain Edu? --      Excl. in GC? --     Constitutional: Alert and oriented. Well appearing and in no acute distress.  Cardiovascular: Normal rate, regular rhythm. Grossly normal heart sounds.  Good peripheral circulation. Respiratory: Normal respiratory effort.  No retractions. Lungs CTAB. Musculoskeletal: No lower extremity tenderness nor edema.  No joint effusions. Neurologic:  Normal speech and language. No gross focal neurologic deficits are appreciated. No gait instability. Skin:  Skin is warm, dry and intact. No rash noted. Psychiatric: Mood and affect are normal. Speech and behavior are normal.  ____________________________________________   LABS (all labs ordered are listed, but only abnormal results are displayed)  Labs Reviewed - No data to display ____________________________________________  EKG   ____________________________________________  RADIOLOGY  Positive degenerative changes  ____________________________________________   PROCEDURES  Procedure(s) performed: None  Critical Care performed: No  ____________________________________________   INITIAL IMPRESSION / ASSESSMENT AND PLAN / ED COURSE  Pertinent labs & imaging results that were available during my care of the patient were reviewed by me and considered in my medical decision making (see chart for details).  Degenerative changes to the right knee. Given for prednisone 50 mg daily 5 days, ibuprofen 800 mg 3 times a day. Patient follow-up with PCP or return to the ER with any worsening symptomology.  Referral given to orthopedics on call as needed for follow-up of current to review his care as needed. ____________________________________________   FINAL CLINICAL IMPRESSION(S) / ED DIAGNOSES  Final diagnoses:  Osteoarthritis of right knee, unspecified osteoarthritis type     This chart was dictated using voice recognition software/Dragon. Despite best efforts to proofread, errors can occur which can change the meaning. Any change was purely unintentional.   Evangeline Dakinharles M Beers, PA-C 08/29/15 1227  Gayla DossEryka A Gayle, MD 08/29/15 [phone]

## 2016-08-19 ENCOUNTER — Emergency Department
Admission: EM | Admit: 2016-08-19 | Discharge: 2016-08-19 | Disposition: A | Discharge status: Home / Self Care | Payer: Self-pay | Attending: Emergency Medicine | Admitting: Emergency Medicine

## 2016-08-19 ENCOUNTER — Emergency Department: Payer: Self-pay

## 2016-08-19 DIAGNOSIS — M25461 Effusion, right knee: Secondary | ICD-10-CM | POA: Insufficient documentation

## 2016-08-19 DIAGNOSIS — I10 Essential (primary) hypertension: Secondary | ICD-10-CM | POA: Insufficient documentation

## 2016-08-19 DIAGNOSIS — B070 Plantar wart: Secondary | ICD-10-CM | POA: Insufficient documentation

## 2016-08-19 DIAGNOSIS — Z79899 Other long term (current) drug therapy: Secondary | ICD-10-CM | POA: Insufficient documentation

## 2016-08-19 DIAGNOSIS — F17200 Nicotine dependence, unspecified, uncomplicated: Secondary | ICD-10-CM | POA: Insufficient documentation

## 2016-08-19 NOTE — ED Provider Notes (Signed)
[facility] Emergency Department Provider Note   ____________________________________________   First MD Initiated Contact with Patient 08/19/16 [phone]     (approximate)  I have reviewed the triage vital signs and the nursing notes.   HISTORY  Chief Complaint Leg Pain    HPI Joseph Waters is a 41 y.o. male patient complaining of 1 week of right lower leg swelling. Patient denies injury. Patient denies any redness or warmth to the area. Patient stated with required prolonged standing outside. Patient rates the pain as a 4/10. Patient described a pain as "achy". Patient stated pain increases with flexion of the knee. No palliative measures for complaint.   Past Medical History:  Diagnosis Date  . Hypertension     There are no active problems to display for this patient.   History reviewed. No pertinent surgical history.  Prior to Admission medications   Medication Sig Start Date End Date Taking? Authorizing Provider  ibuprofen (ADVIL,MOTRIN) 800 MG tablet Take 1 tablet (800 mg total) by mouth every 8 (eight) hours as needed. 08/29/15   Beers, Charmayne Sheerharles M, PA-C  ondansetron (ZOFRAN) 4 MG tablet Take 1 tablet (4 mg total) by mouth every 8 (eight) hours as needed for nausea or vomiting. 12/13/14   Jeanmarie PlantMcShane, James A, MD  predniSONE (DELTASONE) 10 MG tablet Take 5 tablets (50 mg total) by mouth daily with breakfast. 08/29/15   Beers, Charmayne Sheerharles M, PA-C    Allergies Patient has no known allergies.  No family history on file.  Social History Social History  Substance Use Topics  . Smoking status: Current Every Day Smoker  . Smokeless tobacco: Never Used  . Alcohol use Yes    Review of Systems  Constitutional: No fever/chills. Morbid obesity Eyes: No visual changes. ENT: No sore throat. Cardiovascular: Denies chest pain. Respiratory: Denies shortness of breath. Gastrointestinal: No abdominal pain.  No nausea, no vomiting.  No diarrhea.  No  constipation. Genitourinary: Negative for dysuria. Musculoskeletal: Right knee pain  Skin: Negative for rash. Neurological: Negative for headaches, focal weakness or numbness.   ____________________________________________   PHYSICAL EXAM:  VITAL SIGNS: ED Triage Vitals  Enc Vitals Group     BP 08/19/16 0723 (!) 183/106     Pulse Rate 08/19/16 0723 76     Resp 08/19/16 0723 18     Temp 08/19/16 0723 97.6 F (36.4 C)     Temp Source 08/19/16 0723 Oral     SpO2 08/19/16 0723 98 %     Weight 08/19/16 0724 (!) 388 lb 8 oz (176.2 kg)     Height 08/19/16 0724 6' (1.829 m)     Head Circumference --      Peak Flow --      Pain Score 08/19/16 0723 4     Pain Loc --      Pain Edu? --      Excl. in GC? --     Constitutional: Alert and oriented. Well appearing and in no acute distress. Obesity Cardiovascular: Normal rate, regular rhythm. Grossly normal heart sounds.  Good peripheral circulation. Blood pressure Respiratory: Normal respiratory effort.  No retractions. Lungs CTAB. Musculoskeletal: No lower extremity tenderness nor edema. Mild joint effusions. Neurologic:  Normal speech and language. No gross focal neurologic deficits are appreciated. No gait instability. Skin:  Skin is warm, dry and intact. No rash noted. Plantar wart plantar aspect the right foot Psychiatric: Mood and affect are normal. Speech and behavior are normal.  ____________________________________________   LABS (all  labs ordered are listed, but only abnormal results are displayed)  Labs Reviewed - No data to display ____________________________________________  EKG   ____________________________________________  RADIOLOGY  Dg Knee Complete 4 Views Right  Result Date: 08/19/2016 CLINICAL DATA:  Right leg pain and swelling for the past week with symptoms involving the knee as well. The patient reports a pressure sensation in the knee when bending it. EXAM: RIGHT KNEE - COMPLETE 4+ VIEW COMPARISON:   Right knee series dated August 29, 2015 FINDINGS: The bones are subjectively adequately mineralized. There is beaking of the tibial spines. The joint spaces are reasonably well-maintained. There is faint chondrocalcinosis of the medial meniscus. There may be a small suprapatellar effusion. Tiny spurs arise from the articular margins of the patella. IMPRESSION: No acute fracture or dislocation of the right knee. Mild osteoarthritic change centered on the medial and patellofemoral compartments. Small suprapatellar effusion. Electronically Signed   By: David  [place] M.D.   On: 08/19/2016 08:39    ____________________________________________   PROCEDURES  Procedure(s) performed: None  Procedures  Critical Care performed: No  ____________________________________________   INITIAL IMPRESSION / ASSESSMENT AND PLAN / ED COURSE  Pertinent labs & imaging results that were available during my care of the patient were reviewed by me and considered in my medical decision making (see chart for details).  Right knee has small suprapatellar effusion plan is warm right foot. Discuss x-ray finding with patient. Patient given discharge care instructions. Patient given a work no. Patient [facility] his [facility].      ____________________________________________   FINAL CLINICAL IMPRESSION(S) / ED DIAGNOSES  Final diagnoses:  Effusion, right knee  Essential hypertension  Plantar wart, right foot      NEW MEDICATIONS STARTED DURING THIS VISIT:  New Prescriptions   No medications on file     Note:  This document was prepared using Dragon voice recognition software and may include unintentional dictation errors.    Joni Reining, PA-C 08/19/16 4782    Rockne Menghini, MD 08/19/16 1537

## 2016-08-19 NOTE — ED Notes (Signed)
Patient is complaining of right leg pain and swelling x 1 week.  Patient reports pain to his knee and heel particularly.  Patient states, "I feel like my knee has a lot of pressure when I bend it."  Patient reports working outside often and climbing ladders often.  Patient is in no obvious distress at this time.

## 2016-08-19 NOTE — ED Triage Notes (Signed)
Pt c/o right lower leg pain with swelling for the past week, denies injury. Denies redness or hotness to area..Marland Kitchen

## 2016-08-19 NOTE — Discharge Instructions (Signed)
Visit in the pharmacy to get over-the-counter plantar wart medication

## 2017-03-11 ENCOUNTER — Emergency Department: Payer: Self-pay

## 2017-03-11 ENCOUNTER — Encounter: Payer: Self-pay | Admitting: Emergency Medicine

## 2017-03-11 ENCOUNTER — Other Ambulatory Visit: Payer: Self-pay

## 2017-03-11 ENCOUNTER — Emergency Department
Admission: EM | Admit: 2017-03-11 | Discharge: 2017-03-11 | Disposition: A | Discharge status: Home / Self Care | Payer: Self-pay | Attending: Emergency Medicine | Admitting: Emergency Medicine

## 2017-03-11 DIAGNOSIS — M545 Low back pain: Secondary | ICD-10-CM | POA: Insufficient documentation

## 2017-03-11 DIAGNOSIS — I10 Essential (primary) hypertension: Secondary | ICD-10-CM | POA: Insufficient documentation

## 2017-03-11 DIAGNOSIS — K439 Ventral hernia without obstruction or gangrene: Secondary | ICD-10-CM | POA: Insufficient documentation

## 2017-03-11 DIAGNOSIS — F17200 Nicotine dependence, unspecified, uncomplicated: Secondary | ICD-10-CM | POA: Insufficient documentation

## 2017-03-11 LAB — URINALYSIS, COMPLETE (UACMP) WITH MICROSCOPIC
BILIRUBIN URINE: NEGATIVE
Bacteria, UA: NONE SEEN
GLUCOSE, UA: NEGATIVE mg/dL
HGB URINE DIPSTICK: NEGATIVE
KETONES UR: NEGATIVE mg/dL
LEUKOCYTES UA: NEGATIVE
NITRITE: NEGATIVE
PH: 6 (ref 5.0–8.0)
PROTEIN: NEGATIVE mg/dL
RBC / HPF: NONE SEEN RBC/hpf (ref 0–5)
Specific Gravity, Urine: 1.019 (ref 1.005–1.030)
Squamous Epithelial / LPF: NONE SEEN
WBC, UA: NONE SEEN WBC/hpf (ref 0–5)

## 2017-03-11 LAB — COMPREHENSIVE METABOLIC PANEL
ALBUMIN: 4 g/dL (ref 3.5–5.0)
ALT: 27 U/L (ref 17–63)
ANION GAP: 8 (ref 5–15)
AST: 32 U/L (ref 15–41)
Alkaline Phosphatase: 71 U/L (ref 38–126)
BUN: 26 mg/dL — AB (ref 6–20)
CHLORIDE: 104 mmol/L (ref 101–111)
CO2: 26 mmol/L (ref 22–32)
Calcium: 9.2 mg/dL (ref 8.9–10.3)
Creatinine, Ser: 1.43 mg/dL — ABNORMAL HIGH (ref 0.61–1.24)
GFR calc Af Amer: >60 mL/min (ref >60)
GFR calc non Af Amer: 60 mL/min — ABNORMAL LOW (ref >60)
GLUCOSE: 111 mg/dL — AB (ref 65–99)
POTASSIUM: 4.4 mmol/L (ref 3.5–5.1)
Sodium: 138 mmol/L (ref 135–145)
Total Bilirubin: 1 mg/dL (ref 0.3–1.2)
Total Protein: 7.1 g/dL (ref 6.5–8.1)

## 2017-03-11 LAB — CBC WITH DIFFERENTIAL/PLATELET
BASOS ABS: 0.1 10*3/uL (ref 0–0.1)
BASOS PCT: 1 %
EOS ABS: 0.3 10*3/uL (ref 0–0.7)
EOS PCT: 3 %
HCT: 48 % (ref 40.0–52.0)
Hemoglobin: 16.3 g/dL (ref 13.0–18.0)
Lymphocytes Relative: 20 %
Lymphs Abs: 1.9 10*3/uL (ref 1.0–3.6)
MCH: 31.8 pg (ref 26.0–34.0)
MCHC: 33.9 g/dL (ref 32.0–36.0)
MCV: 93.8 fL (ref 80.0–100.0)
MONO ABS: 0.6 10*3/uL (ref 0.2–1.0)
MONOS PCT: 6 %
Neutro Abs: 6.9 10*3/uL — ABNORMAL HIGH (ref 1.4–6.5)
Neutrophils Relative %: 70 %
PLATELETS: 205 10*3/uL (ref 150–440)
RBC: 5.12 MIL/uL (ref 4.40–5.90)
RDW: 12.8 % (ref 11.5–14.5)
WBC: 9.8 10*3/uL (ref 3.8–10.6)

## 2017-03-11 MED ORDER — CYCLOBENZAPRINE HCL 10 MG PO TABS: 10.0000 mg | ORAL_TABLET | Freq: Three times a day (TID) | ORAL | 0 refills | Status: DC | PRN

## 2017-03-11 MED ORDER — OXYCODONE-ACETAMINOPHEN 5-325 MG PO TABS: 1.0000 | ORAL_TABLET | Freq: Three times a day (TID) | ORAL | 0 refills | Status: DC | PRN

## 2017-03-11 MED ORDER — OXYCODONE-ACETAMINOPHEN 5-325 MG PO TABS
2.0000 | ORAL_TABLET | Freq: Once | ORAL | Status: AC
  Administered 2017-03-11: 2 via ORAL
  Filled 2017-03-11: qty 2

## 2017-03-11 NOTE — ED Provider Notes (Signed)
[facility] Emergency Department Provider Note       Time seen: ----------------------------------------- 7:25 AM on 03/11/2017 -----------------------------------------   I have reviewed the triage vital signs and the nursing notes.  HISTORY   Chief Complaint Hernia and Back Pain    HPI Joseph Waters is a 42 y.o. male with a history of hypertension who presents to the ED for lower back pain and hernia pain.  Patient states he has had a ventral hernia for some time.  He feels like it may be related to surgery site for cholecystectomy.  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  He can press the hernia in but it pops back out.  He complains of right-sided low back pain.  He describes the pain as sharp.  Past Medical History:  Diagnosis Date  . Hypertension     There are no active problems to display for this patient.   History reviewed. No pertinent surgical history.  Allergies Patient has no known allergies.  Social History Social History   Tobacco Use  . Smoking status: Current Every Day Smoker  . Smokeless tobacco: Never Used  Substance Use Topics  . Alcohol use: Yes  . Drug use: No    Review of Systems Constitutional: Negative for fever. Cardiovascular: Negative for chest pain. Respiratory: Negative for shortness of breath. Gastrointestinal: Positive for abdominal hernia, negative for vomiting or diarrhea Genitourinary: Negative for dysuria. Musculoskeletal: Positive for back pain Skin: Negative for rash. Neurological: Negative for headaches, focal weakness or numbness.  All systems negative/normal/unremarkable except as stated in the HPI  ____________________________________________   PHYSICAL EXAM:  VITAL SIGNS: ED Triage Vitals  Enc Vitals Group     BP 03/11/17 0518 (!) 174/99     Pulse Rate 03/11/17  0518 87     Resp 03/11/17 0518 18     Temp 03/11/17 0518 98.2 F (36.8 C)     Temp Source 03/11/17 0518 Oral     SpO2 03/11/17 0518 97 %     Weight 03/11/17 0519 (!) 381 lb (172.8 kg)     Height 03/11/17 0519 6' (1.829 m)     Head Circumference --      Peak Flow --      Pain Score 03/11/17 0518 5     Pain Loc --      Pain Edu? --      Excl. in GC? --     Constitutional: Alert and oriented. Well appearing and in no distress.  Morbidly obese Eyes: Conjunctivae are normal. Normal extraocular movements. ENT   Head: Normocephalic and atraumatic.   Nose: No congestion/rhinnorhea.   Mouth/Throat: Mucous membranes are moist.   Neck: No stridor. Cardiovascular: Normal rate, regular rhythm. No murmurs, rubs, or gallops. Respiratory: Normal respiratory effort without tachypnea nor retractions. Breath sounds are clear and equal bilaterally. No wheezes/rales/rhonchi. Gastrointestinal: There is a supraumbilical hernia that can be reduced.  Mildly tender. Musculoskeletal: Nontender with normal range of motion in extremities. No lower extremity tenderness nor edema. Neurologic:  Normal speech and language. No gross focal neurologic deficits are appreciated.  Skin:  Skin is warm, dry and intact. No rash noted. Psychiatric: Mood and affect are normal. Speech and behavior are normal.  ____________________________________________  ED COURSE:  As part of my medical decision making, I reviewed the following data within the electronic  MEDICAL RECORD NUMBER History obtained from family if available, nursing notes, old chart and ekg, as well as notes from prior ED visits. Patient presented for low back pain and hernia pain, we will assess with labs and imaging as indicated at this time.   Procedures ____________________________________________   LABS (pertinent positives/negatives)  Labs Reviewed  URINALYSIS, COMPLETE (UACMP) WITH MICROSCOPIC - Abnormal; Notable for the following components:       Result Value   Color, Urine YELLOW (*)    APPearance CLEAR (*)    All other components within normal limits  CBC WITH DIFFERENTIAL/PLATELET - Abnormal; Notable for the following components:   Neutro Abs 6.9 (*)    All other components within normal limits  COMPREHENSIVE METABOLIC PANEL - Abnormal; Notable for the following components:   Glucose, Bld 111 (*)    BUN 26 (*)    Creatinine, Ser 1.43 (*)    GFR calc non Af Amer 60 (*)    All other components within normal limits    RADIOLOGY Images were viewed by me  CT renal protocol IMPRESSION: Musculoskeletal: There is degenerative change in the lumbar spine, most notably at L5-S1. There are no blastic or lytic bone lesions. There is no appreciable intramuscular or abdominal wall lesion. 1. Ventral hernia containing only fat.  2. No renal or ureteral calculus. No hydronephrosis. No urinary bladder wall thickening.  3. Splenomegaly. Liver also prominent. Etiology uncertain. No focal liver or splenic lesions are evident on this noncontrast enhanced study. Gallbladder is absent. No appreciable biliary duct dilatation.  4. Occasional sigmoid diverticula without diverticulitis. No bowel obstruction. No abscess. No periappendiceal region inflammation. ____________________________________________  DIFFERENTIAL DIAGNOSIS   Muscle strain, renal colic, hernia, incarcerated hernia  FINAL ASSESSMENT AND PLAN  Arthritis, low back pain, ventral hernia   Plan: Patient had presented for accommodation of low back pain and pain from his ventral hernia. Patient's labs revealed mild renal insufficiency. Patient's imaging did reveal degenerative changes in his back particularly in the lower lumbar sacral region.  He will be discharged with pain medicine, muscle relaxants and overall is encouraged to lose weight as I think this is the biggest part of his problem.  He will be referred to general surgery for ventral hernia  repair   Emily FilbertWilliams, Sundra Haddix E, MD   Note: This note was generated in part or whole with voice recognition software. Voice recognition is usually quite accurate but there are transcription errors that can and very often do occur. I apologize for any typographical errors that were not detected and corrected.     Emily FilbertWilliams, Nikeshia Keetch E, MD 03/11/17 [id number]

## 2017-03-11 NOTE — ED Notes (Signed)
Patient ambulatory to lobby with NAD noted. Patient verbalized understanding of discharge instructions, prescriptions and follow-up care.

## 2017-03-11 NOTE — ED Notes (Signed)
Pt states feeling like he has a hernia xmonths, and right lower back pain x4days. Denies injury. Denies sensation changes. Denies changes in bowel, bladder control.

## 2017-03-11 NOTE — ED Triage Notes (Signed)
Pt arrived to the ED for complaints of lower back pain and hernia pain. Pt is AOx4 in no apparent distress.

## 2017-03-17 ENCOUNTER — Telehealth: Payer: Self-pay | Admitting: General Practice

## 2017-03-17 NOTE — Telephone Encounter (Signed)
Called and left a message on patients voicemail to call the office patient Patient was seen in the ED for ventral hernia. Advise patient that we received a referral from the ED for patient to follow up with a surgeon to discuss treatment options. No rush for patient to be seen. Please schedule if the patient calls back.

## 2017-03-24 ENCOUNTER — Encounter: Payer: Self-pay | Admitting: General Practice

## 2017-03-24 NOTE — Telephone Encounter (Signed)
Left another message for the patient to call the office, also mailed a letter to the patient to contact our office. °

## 2017-09-07 ENCOUNTER — Emergency Department
Admission: EM | Admit: 2017-09-07 | Discharge: 2017-09-07 | Disposition: A | Discharge status: Home / Self Care | Payer: Self-pay | Attending: Emergency Medicine | Admitting: Emergency Medicine

## 2017-09-07 ENCOUNTER — Encounter: Payer: Self-pay | Admitting: Emergency Medicine

## 2017-09-07 ENCOUNTER — Other Ambulatory Visit: Payer: Self-pay

## 2017-09-07 ENCOUNTER — Emergency Department: Payer: Self-pay

## 2017-09-07 DIAGNOSIS — Y929 Unspecified place or not applicable: Secondary | ICD-10-CM | POA: Insufficient documentation

## 2017-09-07 DIAGNOSIS — Y9389 Activity, other specified: Secondary | ICD-10-CM | POA: Insufficient documentation

## 2017-09-07 DIAGNOSIS — F17210 Nicotine dependence, cigarettes, uncomplicated: Secondary | ICD-10-CM | POA: Insufficient documentation

## 2017-09-07 DIAGNOSIS — W294XXA Contact with nail gun, initial encounter: Secondary | ICD-10-CM | POA: Insufficient documentation

## 2017-09-07 DIAGNOSIS — S61431A Puncture wound without foreign body of right hand, initial encounter: Secondary | ICD-10-CM

## 2017-09-07 DIAGNOSIS — I10 Essential (primary) hypertension: Secondary | ICD-10-CM | POA: Insufficient documentation

## 2017-09-07 DIAGNOSIS — Z79899 Other long term (current) drug therapy: Secondary | ICD-10-CM | POA: Insufficient documentation

## 2017-09-07 DIAGNOSIS — Z8679 Personal history of other diseases of the circulatory system: Secondary | ICD-10-CM

## 2017-09-07 DIAGNOSIS — Y999 Unspecified external cause status: Secondary | ICD-10-CM | POA: Insufficient documentation

## 2017-09-07 MED ORDER — TRAMADOL HCL 50 MG PO TABS: 50.0000 mg | ORAL_TABLET | Freq: Two times a day (BID) | ORAL | 0 refills | Status: DC

## 2017-09-07 MED ORDER — SULFAMETHOXAZOLE-TRIMETHOPRIM 800-160 MG PO TABS
1.0000 | ORAL_TABLET | Freq: Once | ORAL | Status: AC
Start: 2017-09-07 — End: 2017-09-07
  Administered 2017-09-07: 1 via ORAL
  Filled 2017-09-07: qty 1

## 2017-09-07 MED ORDER — SULFAMETHOXAZOLE-TRIMETHOPRIM 800-160 MG PO TABS: 1.0000 | ORAL_TABLET | Freq: Two times a day (BID) | ORAL | 0 refills | Status: DC

## 2017-09-07 MED ORDER — TETANUS-DIPHTH-ACELL PERTUSSIS 5-2.5-18.5 LF-MCG/0.5 IM SUSP
0.5000 mL | Freq: Once | INTRAMUSCULAR | Status: AC
  Administered 2017-09-07: 0.5 mL via INTRAMUSCULAR
  Filled 2017-09-07: qty 0.5

## 2017-09-07 MED ORDER — AMLODIPINE BESYLATE 5 MG PO TABS
5.0000 mg | ORAL_TABLET | Freq: Once | ORAL | Status: AC
  Administered 2017-09-07: 5 mg via ORAL
  Filled 2017-09-07: qty 1

## 2017-09-07 MED ORDER — HYDROCODONE-ACETAMINOPHEN 5-325 MG PO TABS
1.0000 | ORAL_TABLET | Freq: Once | ORAL | Status: AC
  Administered 2017-09-07: 1 via ORAL
  Filled 2017-09-07: qty 1

## 2017-09-07 MED ORDER — AMLODIPINE BESYLATE 5 MG PO TABS: 5.0000 mg | ORAL_TABLET | Freq: Every day | ORAL | 1 refills | Status: DC

## 2017-09-07 NOTE — Discharge Instructions (Addendum)
You are being treated for a puncture wound to the right hand. There is evidence of a small bone injury. Take the prescription antibiotic as directed. Take the blood pressure medicine daily. [facility]. Return to the ED immediately for any signs of infection.

## 2017-09-07 NOTE — ED Provider Notes (Signed)
[facility] Emergency Department Provider Note ____________________________________________  Time seen: 1638  I have reviewed the triage vital signs and the nursing notes.  HISTORY  Chief Complaint  Hand Pain  HPI Joseph Waters is a 42 y.o. male presents to the ED accompanied by his wife, for evaluation of an accidental injury to the right hand.  Patient describes the nail gun earlier today, when he accidentally shot a galvanized nail into his dorsal right hand.  He describes a nail entering just behind the knuckle of his index finger, and a diagonal position.  He removed the nail without difficulty.  He denies any active bleeding from the hand.  In the 3 hours since the accident, the patient has had some increased swelling around the puncture site.  He also notes some tightness with trying to perform a composite fist. He denies any fevers, chills, or sweats.  Past Medical History:  Diagnosis Date  . Hypertension     There are no active problems to display for this patient.   History reviewed. No pertinent surgical history.  Prior to Admission medications   Medication Sig Start Date End Date Taking? Authorizing Provider  amLODipine (NORVASC) 5 MG tablet Take 1 tablet (5 mg total) by mouth daily. 09/07/17 11/06/17  Joseph Waters, Joseph Ivory, PA-C  cyclobenzaprine (FLEXERIL) 10 MG tablet Take 1 tablet (10 mg total) by mouth 3 (three) times daily as needed for muscle spasms. 03/11/17   Joseph Filbert, MD  ibuprofen (ADVIL,MOTRIN) 800 MG tablet Take 1 tablet (800 mg total) by mouth every 8 (eight) hours as needed. Patient not taking: Reported on 03/11/2017 08/29/15   Joseph Dakin, PA-C  ondansetron (ZOFRAN) 4 MG tablet Take 1 tablet (4 mg total) by mouth every 8 (eight) hours as needed for nausea or vomiting. Patient not taking: Reported on 03/11/2017 12/13/14   Jeanmarie Plant, MD  oxyCODONE-acetaminophen (PERCOCET) 5-325 MG tablet Take 1-2 tablets by mouth every 8  (eight) hours as needed. 03/11/17   Joseph Filbert, MD  Potassium (POTASSIMIN PO) Take 1 tablet by mouth daily as needed.    [provider]  predniSONE (DELTASONE) 10 MG tablet Take 5 tablets (50 mg total) by mouth daily with breakfast. Patient not taking: Reported on 03/11/2017 08/29/15   Beers, Charmayne Sheer, PA-C  sulfamethoxazole-trimethoprim (BACTRIM DS,SEPTRA DS) 800-160 MG tablet Take 1 tablet by mouth 2 (two) times daily. 09/07/17   Joseph Waters, Joseph Ivory, PA-C  traMADol (ULTRAM) 50 MG tablet Take 1 tablet (50 mg total) by mouth 2 (two) times daily. 09/07/17   Joseph Waters, Joseph Ivory, PA-C    Allergies Patient has no known allergies.  No family history on file.  Social History Social History   Tobacco Use  . Smoking status: Current Every Day Smoker  . Smokeless tobacco: Never Used  Substance Use Topics  . Alcohol use: Yes  . Drug use: No    Review of Systems  Constitutional: Negative for fever. Cardiovascular: Negative for chest pain. Respiratory: Negative for shortness of breath. Musculoskeletal: Negative for back pain. Right hand pain as above Skin: Negative for rash. Neurological: Negative for headaches, focal weakness or numbness. ____________________________________________  PHYSICAL EXAM:  VITAL SIGNS: ED Triage Vitals [09/07/17 1616]  Enc Vitals Group     BP (!) 145/104     Pulse Rate 94     Resp 20     Temp 98.6 F (37 C)     Temp Source Oral  SpO2 98 %     Weight (!) 380 lb (172.4 kg)     Height 6' (1.829 m)     Head Circumference      Peak Flow      Pain Score 10     Pain Loc      Pain Edu?      Excl. in [place]?     Constitutional: Alert and oriented. Well appearing and in no distress. Head: Normocephalic and atraumatic. Cardiovascular: Normal rate, regular rhythm. Normal distal pulses. Respiratory: Normal respiratory effort.  Musculoskeletal: Right hand without obvious deformity, dislocation, or effusion.  Patient with normal composite  fist.  Patient did have a very small proximal to the first MCP dorsally.  There is also some mild swelling around the area.  No erythema, warmth, bleeding, or purulence is noted.  Nontender with normal range of motion in all extremities.  Neurologic:  Normal g gross sensation.  Intrinsic and opposition testing normal speech and language. No gross focal neurologic deficits are appreciated. Skin:  Skin is warm, dry and intact. No rash noted. ____________________________________________   RADIOLOGY  Right Hand   IMPRESSION: Cortical defect in the third metacarpal, as outlined with arrows. ____________________________________________  PROCEDURES  Procedures Tdap 0.5 ml IM Bactrim DS 1 PO Norco 5-325 mg PO Norvasc 5 mg PO ____________________________________________  INITIAL IMPRESSION / ASSESSMENT AND PLAN / ED COURSE  Patient with ED evaluation of a accidental puncture wound to the right hand.  Patient had a nail impaled in the hand but removed prior to arrival.  His x-ray confirms a small cortical defect to the third metacarpal.  Patient will be treated empirically with Bactrim for a puncture wound.  Patient also has uncontrolled hypertension previously on lisinopril.  He discontinued medication secondary to unfavorable side effects.  He is agreeable to start a prescription of amlodipine at this time.  His labs from January were reviewed with a slight elevation in his BUN and creatinine.  [facility] for routine care and prescriptions, respectively.  Patient is encouraged to monitor the hand for any signs of infection and return to the ED immediately as discussed.  Work note is provided for 1 day as requested.  I reviewed the patient's prescription history over the last 12 months in the multi-state controlled substances database(s) that includes [place], [place], [place], [place], [place], [place], [place], [place], [place], [place], [place], and [place].  Results were notable for no recent/current prescriptions. ____________________________________________  FINAL CLINICAL IMPRESSION(S) / ED DIAGNOSES  Final diagnoses:  Puncture wound of right hand without foreign body, initial encounter  History of uncontrolled hypertension      Telly Broberg, Joseph IvoryJenise V Bacon, PA-C 09/09/17 1100    Joseph SemenGoodman, Gray[place], MD 09/10/17 1611

## 2017-09-07 NOTE — ED Triage Notes (Signed)
States he was using a nail gun  And he accidentally shot a nail in right hand  puncture wound noted   Pos swelling

## 2018-09-06 ENCOUNTER — Encounter: Payer: Self-pay | Admitting: Emergency Medicine

## 2018-09-06 ENCOUNTER — Emergency Department
Admission: EM | Admit: 2018-09-06 | Discharge: 2018-09-06 | Disposition: A | Discharge status: Home / Self Care | Payer: Self-pay | Attending: Emergency Medicine | Admitting: Emergency Medicine

## 2018-09-06 ENCOUNTER — Other Ambulatory Visit: Payer: Self-pay

## 2018-09-06 DIAGNOSIS — Z79899 Other long term (current) drug therapy: Secondary | ICD-10-CM | POA: Insufficient documentation

## 2018-09-06 DIAGNOSIS — L723 Sebaceous cyst: Secondary | ICD-10-CM | POA: Insufficient documentation

## 2018-09-06 DIAGNOSIS — F17200 Nicotine dependence, unspecified, uncomplicated: Secondary | ICD-10-CM | POA: Insufficient documentation

## 2018-09-06 DIAGNOSIS — I10 Essential (primary) hypertension: Secondary | ICD-10-CM | POA: Insufficient documentation

## 2018-09-06 DIAGNOSIS — L089 Local infection of the skin and subcutaneous tissue, unspecified: Secondary | ICD-10-CM | POA: Insufficient documentation

## 2018-09-06 MED ORDER — OXYCODONE-ACETAMINOPHEN 7.5-325 MG PO TABS: 1.0000 | ORAL_TABLET | ORAL | 0 refills | Status: DC | PRN

## 2018-09-06 MED ORDER — LIDOCAINE HCL (PF) 1 % IJ SOLN
5.0000 mL | Freq: Once | INTRAMUSCULAR | Status: AC
  Administered 2018-09-06: 5 mL

## 2018-09-06 MED ORDER — LIDOCAINE HCL (PF) 1 % IJ SOLN
INTRAMUSCULAR | Status: AC
  Administered 2018-09-06: 5 mL
  Filled 2018-09-06: qty 5

## 2018-09-06 MED ORDER — SULFAMETHOXAZOLE-TRIMETHOPRIM 800-160 MG PO TABS: 1.0000 | ORAL_TABLET | Freq: Two times a day (BID) | ORAL | 0 refills | Status: DC

## 2018-09-06 NOTE — ED Provider Notes (Signed)
[facility] Emergency Department Provider Note   ____________________________________________   First MD Initiated Contact with Patient 09/06/18 1051     (approximate)  I have reviewed the triage vital signs and the nursing notes.   HISTORY  Chief Complaint Abscess    HPI Joseph Waters is a 43 y.o. male presents with abscess to the right upper posterior shoulder.  Patient state lesion started as a small pimple which is why he tried to ruptured.  Patient stated in the past 2 days the lesion has grown bigger and painful.  Patient states pain is a constant 4/10 but increases to a 7/10 when pressure is applied.  No other palliative measures for complaint.        Patient presents Past Medical History:  Diagnosis Date  . Hypertension     There are no active problems to display for this patient.   History reviewed. No pertinent surgical history.  Prior to Admission medications   Medication Sig Start Date End Date Taking? Authorizing Provider  amLODipine (NORVASC) 10 MG tablet Take 10 mg by mouth daily.   Yes [provider]  oxyCODONE-acetaminophen (PERCOCET) 7.5-325 MG tablet Take 1 tablet by mouth every 4 (four) hours as needed for severe pain. 09/06/18 09/06/19  Sable Feil, PA-C  sulfamethoxazole-trimethoprim (BACTRIM DS) 800-160 MG tablet Take 1 tablet by mouth 2 (two) times daily. 09/06/18   Sable Feil, PA-C    Allergies Patient has no known allergies.  No family history on file.  Social History Social History   Tobacco Use  . Smoking status: Current Every Day Smoker  . Smokeless tobacco: Never Used  Substance Use Topics  . Alcohol use: Yes  . Drug use: No    Review of Systems Constitutional: No fever/chills Eyes: No visual changes. ENT: No sore throat. Cardiovascular: Denies chest pain. Respiratory: Denies shortness of breath. Gastrointestinal: No abdominal pain.  No nausea, no vomiting.  No diarrhea.  No  constipation. Genitourinary: Negative for dysuria. Musculoskeletal: Negative for back pain. Skin: Negative for rash. Neurological: Negative for headaches, focal weakness or numbness. Endocrine:  Hypertension.  ____________________________________________   PHYSICAL EXAM:  VITAL SIGNS: ED Triage Vitals  Enc Vitals Group     BP 09/06/18 1046 (!) 189/109     Pulse Rate 09/06/18 1046 82     Resp 09/06/18 1046 18     Temp 09/06/18 1046 98.7 F (37.1 C)     Temp Source 09/06/18 1046 Oral     SpO2 09/06/18 1046 97 %     Weight 09/06/18 1047 (!) 365 lb (165.6 kg)     Height 09/06/18 1047 6' (1.829 m)     Head Circumference --      Peak Flow --      Pain Score 09/06/18 1047 4     Pain Loc --      Pain Edu? --      Excl. in [place]? --    Constitutional: Alert and oriented. Well appearing and in no acute distress.  Morbid obesity. Hematological/Lymphatic/Immunilogical: No cervical lymphadenopathy. Cardiovascular: Normal rate, regular rhythm. Grossly normal heart sounds.  Good peripheral circulation. Respiratory: Normal respiratory effort.  No retractions. Lungs CTAB. Skin: Fluctuant nodular lesion right upper posterior shoulder. Psychiatric: Mood and affect are normal. Speech and behavior are normal.  ____________________________________________   LABS (all labs ordered are listed, but only abnormal results are displayed)  Labs Reviewed - No data to display ____________________________________________  EKG   ____________________________________________  RADIOLOGY  ED MD interpretation:    Official radiology report(s): No results found.  ____________________________________________   PROCEDURES  Procedure(s) performed (including Critical Care):  Marland Kitchen.Marland Kitchen.Incision and Drainage  Date/Time: 09/06/2018 11:22 AM Performed by: Joni ReiningSmith, Keanu Frickey K, PA-C Authorized by: Joni ReiningSmith, Chaim Gatley K, PA-C   Consent:    Consent obtained:  Verbal   Consent given by:  Patient   Risks discussed:   Bleeding, incomplete drainage, pain and infection Location:    Type:  Abscess   Location:  Upper extremity   Upper extremity location:  Shoulder   Shoulder location:  R shoulder Pre-procedure details:    Skin preparation:  Betadine Anesthesia (see MAR for exact dosages):    Anesthesia method:  Local infiltration   Local anesthetic:  Lidocaine 1% w/o epi Procedure type:    Complexity:  Simple Procedure details:    Needle aspiration: no     Incision types:  Single with marsupialization   Incision depth:  Dermal   Scalpel blade:  11   Wound management:  Probed and deloculated   Drainage:  Purulent   Drainage amount:  Scant   Wound treatment:  Wound left open   Packing materials:  1/4 in iodoform gauze Post-procedure details:    Patient tolerance of procedure:  Tolerated well, no immediate complications     ____________________________________________   INITIAL IMPRESSION / ASSESSMENT AND PLAN / ED COURSE  As part of my medical decision making, I reviewed the following data within the electronic MEDICAL RECORD NUMBER         Joseph Waters was evaluated in Emergency Department on 09/06/2018 for the symptoms described in the history of present illness. He was evaluated in the context of the global COVID-19 pandemic, which necessitated consideration that the patient might be at risk for infection with the SARS-CoV-2 virus that causes COVID-19. Institutional protocols and algorithms that pertain to the evaluation of patients at risk for COVID-19 are in a state of rapid change based on information released by regulatory bodies including the CDC and federal and state organizations. These policies and algorithms were followed during the patient's care in the ED.    Patient presents with nausea lesion right upper posterior shoulder consistent with sebaceous cyst.  After obtaining patient consent area was incised and drained.  Patient given discharge care instruction return back in 2 days for  wound check.  Patient started on antibiotics.   ____________________________________________   FINAL CLINICAL IMPRESSION(S) / ED DIAGNOSES  Final diagnoses:  Infected sebaceous cyst     ED Discharge Orders         Ordered    sulfamethoxazole-trimethoprim (BACTRIM DS) 800-160 MG tablet  2 times daily     09/06/18 1116    oxyCODONE-acetaminophen (PERCOCET) 7.5-325 MG tablet  Every 4 hours PRN     09/06/18 1116           Note:  This document was prepared using Dragon voice recognition software and may include unintentional dictation errors.    Joni ReiningSmith, Kieanna Rollo K, PA-C 09/06/18 1126    Minna AntisPaduchowski, Kevin, MD 09/06/18 1445

## 2018-09-06 NOTE — ED Triage Notes (Signed)
Presents with possible abscess area to right shoulder  States he noticed the area couple of days ago

## 2018-09-08 ENCOUNTER — Emergency Department
Admission: EM | Admit: 2018-09-08 | Discharge: 2018-09-08 | Disposition: A | Discharge status: Home / Self Care | Payer: Self-pay | Attending: Emergency Medicine | Admitting: Emergency Medicine

## 2018-09-08 ENCOUNTER — Other Ambulatory Visit: Payer: Self-pay

## 2018-09-08 DIAGNOSIS — I10 Essential (primary) hypertension: Secondary | ICD-10-CM | POA: Insufficient documentation

## 2018-09-08 DIAGNOSIS — L02413 Cutaneous abscess of right upper limb: Secondary | ICD-10-CM | POA: Insufficient documentation

## 2018-09-08 DIAGNOSIS — Z5189 Encounter for other specified aftercare: Secondary | ICD-10-CM

## 2018-09-08 DIAGNOSIS — F17200 Nicotine dependence, unspecified, uncomplicated: Secondary | ICD-10-CM | POA: Insufficient documentation

## 2018-09-08 NOTE — ED Notes (Signed)
Pt alert and oriented X4, active, cooperative, pt in NAD. RR even and unlabored, color WNL.  Pt informed to return if any life threatening symptoms occur.  Discharge and followup instructions reviewed. Ambulates safely. 

## 2018-09-08 NOTE — ED Triage Notes (Signed)
Abscess drained on Monday, minimal soreness since. Pt alert and oriented X4, active, cooperative, pt in NAD. RR even and unlabored, color WNL.

## 2018-09-08 NOTE — ED Provider Notes (Signed)
[facility] Emergency Department Provider Note   ____________________________________________   First MD Initiated Contact with Patient 09/08/18 1038     (approximate)  I have reviewed the triage vital signs and the nursing notes.   HISTORY  Chief Complaint Shoulder Injury    HPI Joseph Waters is a 43 y.o. male patient presents for wound check secondary to incision and drainage for abscess to the right posterior shoulder.  Patient voices no concerns.  Taking antibiotics as directed.         Past Medical History:  Diagnosis Date  . Hypertension     There are no active problems to display for this patient.   History reviewed. No pertinent surgical history.  Prior to Admission medications   Medication Sig Start Date End Date Taking? Authorizing Provider  amLODipine (NORVASC) 10 MG tablet Take 10 mg by mouth daily.    [provider]  oxyCODONE-acetaminophen (PERCOCET) 7.5-325 MG tablet Take 1 tablet by mouth every 4 (four) hours as needed for severe pain. 09/06/18 09/06/19  Sable Feil, PA-C  sulfamethoxazole-trimethoprim (BACTRIM DS) 800-160 MG tablet Take 1 tablet by mouth 2 (two) times daily. 09/06/18   Sable Feil, PA-C    Allergies Patient has no known allergies.  No family history on file.  Social History Social History   Tobacco Use  . Smoking status: Current Every Day Smoker  . Smokeless tobacco: Never Used  Substance Use Topics  . Alcohol use: Yes  . Drug use: No    Review of Systems  Constitutional: No fever/chills Eyes: No visual changes. ENT: No sore throat. Cardiovascular: Denies chest pain. Respiratory: Denies shortness of breath. Gastrointestinal: No abdominal pain.  No nausea, no vomiting.  No diarrhea.  No constipation. Genitourinary: Negative for dysuria. Musculoskeletal: Negative for back pain. Skin: Negative for rash.  Abscess right shoulder Neurological: Negative for headaches, focal weakness  or numbness. Endocrine:  Hypertension ____________________________________________   PHYSICAL EXAM:  VITAL SIGNS: ED Triage Vitals [09/08/18 1040]  Enc Vitals Group     BP      Pulse      Resp      Temp      Temp src      SpO2      Weight (!) 363 lb 12.1 oz (165 kg)     Height 6' (1.829 m)     Head Circumference      Peak Flow      Pain Score      Pain Loc      Pain Edu?      Excl. in [place]?     Constitutional: Alert and oriented. Well appearing and in no acute distress. Cardiovascular: Normal rate, regular rhythm. Grossly normal heart sounds.  Good peripheral circulation. Respiratory: Normal respiratory effort.  No retractions. Lungs CTAB. Skin:  Skin is warm, dry and intact. No rash noted.  Decreased edema and erythema tender right shoulder. Psychiatric: Mood and affect are normal. Speech and behavior are normal.  ____________________________________________   LABS (all labs ordered are listed, but only abnormal results are displayed)  Labs Reviewed - No data to display ____________________________________________  EKG   ____________________________________________  RADIOLOGY  ED MD interpretation:    Official radiology report(s): No results found.  ____________________________________________   PROCEDURES  Procedure(s) performed (including Critical Care):  Procedures   ____________________________________________   INITIAL IMPRESSION / ASSESSMENT AND PLAN / ED COURSE  As part of my medical decision making, I reviewed the following data within  the electronic MEDICAL RECORD NUMBER         Elisabeth PigeonCharlie Linnemann was evaluated in Emergency Department on 09/08/2018 for the symptoms described in the history of present illness. He was evaluated in the context of the global COVID-19 pandemic, which necessitated consideration that the patient might be at risk for infection with the SARS-CoV-2 virus that causes COVID-19. Institutional protocols and algorithms that  pertain to the evaluation of patients at risk for COVID-19 are in a state of rapid change based on information released by regulatory bodies including the CDC and federal and state organizations. These policies and algorithms were followed during the patient's care in the ED.   Patient presents for wound check secondary to incision and drainage of an abscess to the right posterior shoulder.  Packing material was removed and clear return with irrigation.  As read cleaned and bandaged and patient given discharge care instructions.  Continue antibiotics till finished.  Return to ED if condition worsens.      ____________________________________________   FINAL CLINICAL IMPRESSION(S) / ED DIAGNOSES  Final diagnoses:  Wound check, abscess     ED Discharge Orders    None       Note:  This document was prepared using Dragon voice recognition software and may include unintentional dictation errors.    Joni ReiningSmith, Clela Hagadorn K, PA-C 09/08/18 1056    Concha SeFunke, Mary E, MD 09/08/18 [phone]

## 2018-09-30 ENCOUNTER — Emergency Department: Payer: Self-pay

## 2018-09-30 ENCOUNTER — Encounter: Payer: Self-pay | Admitting: Emergency Medicine

## 2018-09-30 ENCOUNTER — Other Ambulatory Visit: Payer: Self-pay

## 2018-09-30 ENCOUNTER — Emergency Department
Admission: EM | Admit: 2018-09-30 | Discharge: 2018-09-30 | Disposition: A | Discharge status: Home / Self Care | Payer: Self-pay | Attending: Emergency Medicine | Admitting: Emergency Medicine

## 2018-09-30 DIAGNOSIS — F17200 Nicotine dependence, unspecified, uncomplicated: Secondary | ICD-10-CM | POA: Insufficient documentation

## 2018-09-30 DIAGNOSIS — I10 Essential (primary) hypertension: Secondary | ICD-10-CM | POA: Insufficient documentation

## 2018-09-30 DIAGNOSIS — Z79899 Other long term (current) drug therapy: Secondary | ICD-10-CM | POA: Insufficient documentation

## 2018-09-30 DIAGNOSIS — M79671 Pain in right foot: Secondary | ICD-10-CM | POA: Insufficient documentation

## 2018-09-30 DIAGNOSIS — Z7982 Long term (current) use of aspirin: Secondary | ICD-10-CM | POA: Insufficient documentation

## 2018-09-30 MED ORDER — IBUPROFEN 800 MG PO TABS: 800.0000 mg | ORAL_TABLET | Freq: Three times a day (TID) | ORAL | 0 refills | Status: DC | PRN

## 2018-09-30 MED ORDER — IBUPROFEN 800 MG PO TABS
800.0000 mg | ORAL_TABLET | Freq: Once | ORAL | Status: AC
  Administered 2018-09-30: 800 mg via ORAL
  Filled 2018-09-30: qty 1

## 2018-09-30 MED ORDER — AMLODIPINE BESYLATE 10 MG PO TABS: 10.0000 mg | ORAL_TABLET | Freq: Every day | ORAL | 0 refills | Status: DC

## 2018-09-30 MED ORDER — OXYCODONE-ACETAMINOPHEN 5-325 MG PO TABS
1.0000 | ORAL_TABLET | Freq: Once | ORAL | Status: AC
  Administered 2018-09-30: 05:00:00 1 via ORAL
  Filled 2018-09-30: qty 1

## 2018-09-30 MED ORDER — OXYCODONE-ACETAMINOPHEN 5-325 MG PO TABS: 1.0000 | ORAL_TABLET | ORAL | 0 refills | Status: DC | PRN

## 2018-09-30 NOTE — Discharge Instructions (Addendum)
1.  Amlodipine 10 mg tablets have been refilled for you. 2.  Take pain medicines as needed (Motrin/Percocet #15). 3.  Use walker to help you balance as you walk.  You may remove podiatric shoe as needed. 4.  Return to the ER for worsening symptoms, persistent vomiting, difficulty breathing or other concerns.

## 2018-09-30 NOTE — ED Triage Notes (Signed)
Patient ambulatory to triage with steady gait, without difficulty or distress noted, mask in place; pt reports rt foot pain/swelling since yesterday with no known injury

## 2018-09-30 NOTE — ED Provider Notes (Signed)
[facility] Emergency Department Provider Note   ____________________________________________   First MD Initiated Contact with Patient 09/30/18 [phone]     (approximate)  I have reviewed the triage vital signs and the nursing notes.   HISTORY  Chief Complaint Foot Pain    HPI Joseph Waters is a 43 y.o. male who presents to the ED from home with a chief complaint of nontraumatic right foot pain.  Patient noted pain to his dorsal right foot yesterday.  Denies fall/injury/trauma.  It was a dull pain all day but awoke patient from sleep prior to arrival.  Denies fever, cough, chest pain, shortness of breath, abdominal pain, nausea or vomiting.  Also would like a refill on his amlodipine which he ran out of almost 1 week ago.       Past Medical History:  Diagnosis Date  . Hypertension     There are no active problems to display for this patient.   History reviewed. No pertinent surgical history.  Prior to Admission medications   Medication Sig Start Date End Date Taking? Authorizing Provider  amLODipine (NORVASC) 10 MG tablet Take 1 tablet (10 mg total) by mouth daily. 09/30/18  Yes Irean HongSung, Jade J, MD  aspirin 325 MG tablet Take 325 mg by mouth daily.   Yes [provider]  hydrochlorothiazide (HYDRODIURIL) 25 MG tablet Take 25 mg by mouth daily. For 14 days. 08/08/18   [provider]  ibuprofen (ADVIL) 800 MG tablet Take 1 tablet (800 mg total) by mouth every 8 (eight) hours as needed for moderate pain. 09/30/18   Irean HongSung, Jade J, MD  oxyCODONE-acetaminophen (PERCOCET/ROXICET) 5-325 MG tablet Take 1 tablet by mouth every 4 (four) hours as needed for severe pain. 09/30/18   Irean HongSung, Jade J, MD  pravastatin (PRAVACHOL) 40 MG tablet Take 40 mg by mouth at bedtime. For 30 days. 08/24/18   [provider]  sulfamethoxazole-trimethoprim (BACTRIM DS) 800-160 MG tablet Take 1 tablet by mouth 2 (two) times daily. Patient not taking: Reported on  09/30/2018 09/06/18   Joni ReiningSmith, Ronald K, PA-C    Allergies Patient has no known allergies.  No family history on file.  Social History Social History   Tobacco Use  . Smoking status: Current Every Day Smoker  . Smokeless tobacco: Never Used  Substance Use Topics  . Alcohol use: Yes  . Drug use: No    Review of Systems  Constitutional: No fever/chills Eyes: No visual changes. ENT: No sore throat. Cardiovascular: Denies chest pain. Respiratory: Denies shortness of breath. Gastrointestinal: No abdominal pain.  No nausea, no vomiting.  No diarrhea.  No constipation. Genitourinary: Negative for dysuria. Musculoskeletal: Positive for right foot pain.  Negative for back pain. Skin: Negative for rash. Neurological: Negative for headaches, focal weakness or numbness.   ____________________________________________   PHYSICAL EXAM:  VITAL SIGNS: ED Triage Vitals  Enc Vitals Group     BP 09/30/18 0450 (!) 175/105     Pulse Rate 09/30/18 0450 83     Resp 09/30/18 0450 19     Temp 09/30/18 0450 98.5 F (36.9 C)     Temp Source 09/30/18 0450 Oral     SpO2 09/30/18 0450 97 %     Weight 09/30/18 0446 (!) 372 lb (168.7 kg)     Height 09/30/18 0446 6' (1.829 m)     Head Circumference --      Peak Flow --      Pain Score 09/30/18 0446 8  Pain Loc --      Pain Edu? --      Excl. in GC? --     Constitutional: Alert and oriented. Well appearing and in no acute distress. Eyes: Conjunctivae are normal. PERRL. EOMI. Head: Atraumatic. Nose: No congestion/rhinnorhea. Mouth/Throat: Mucous membranes are moist.  Oropharynx non-erythematous. Neck: No stridor.   Cardiovascular: Normal rate, regular rhythm. Grossly normal heart sounds.  Good peripheral circulation. Respiratory: Normal respiratory effort.  No retractions. Lungs CTAB. Gastrointestinal: Soft and nontender. No distention. No abdominal bruits. No CVA tenderness. Musculoskeletal:  Right foot: Slight erythema over her second  and third metatarsals.  Pain on full range of motion.  Chronic 2+ nonpitting BLE edema.  2+ distal pulses.  Brisk, less than 5-second capillary refill. Neurologic:  Normal speech and language. No gross focal neurologic deficits are appreciated.  Skin:  Skin is warm, dry and intact. No rash noted. Psychiatric: Mood and affect are normal. Speech and behavior are normal.  ____________________________________________   LABS (all labs ordered are listed, but only abnormal results are displayed)  Labs Reviewed - No data to display ____________________________________________  EKG  None ____________________________________________  RADIOLOGY  ED MD interpretation:  No acute findings  Official radiology report(s): Dg Foot Complete Right  Result Date: 09/30/2018 CLINICAL DATA:  Foot pain and swelling EXAM: RIGHT FOOT COMPLETE - 3+ VIEW COMPARISON:  None. FINDINGS: There is no evidence of fracture or dislocation. No evident erosion. Degenerative spurring at the ankle, posterior subtalar joint, and plantar calcaneus. IMPRESSION: 1. No acute finding. 2. Spurring at the ankle and heel. Electronically Signed   By: Marnee SpringJonathon  Watts M.D.   On: 09/30/2018 05:35    ____________________________________________   PROCEDURES  Procedure(s) performed (including Critical Care):  Procedures   ____________________________________________   INITIAL IMPRESSION / ASSESSMENT AND PLAN / ED COURSE  As part of my medical decision making, I reviewed the following data within the electronic MEDICAL RECORD NUMBER History obtained from family, Nursing notes reviewed and incorporated, Radiograph reviewed and Notes from prior ED visits     Joseph Waters was evaluated in Emergency Department on 09/30/2018 for the symptoms described in the history of present illness. He was evaluated in the context of the global COVID-19 pandemic, which necessitated consideration that the patient might be at risk for infection with  the SARS-CoV-2 virus that causes COVID-19. Institutional protocols and algorithms that pertain to the evaluation of patients at risk for COVID-19 are in a state of rapid change based on information released by regulatory bodies including the CDC and federal and state organizations. These policies and algorithms were followed during the patient's care in the ED.   43 year old male who presents with nontraumatic dorsal right foot pain.  Will obtain x-ray imaging study, administer NSAIDs, analgesia and reassess.  Clinical Course as of Sep 29 541  Thu Sep 30, 2018  0542 Updated patient and spouse of xray results. Will place in podiatric shoe, walker and follow up with podiatry. Strict return precautions given. Both verbalize understanding and agree with plan of care.   [JS]    Clinical Course User Index [JS] Irean HongSung, Jade J, MD     ____________________________________________   FINAL CLINICAL IMPRESSION(S) / ED DIAGNOSES  Final diagnoses:  Foot pain, right  Essential hypertension     ED Discharge Orders         Ordered    amLODipine (NORVASC) 10 MG tablet  Daily     09/30/18 0521    ibuprofen (ADVIL) 800 MG tablet  Every 8 hours PRN     09/30/18 0522    oxyCODONE-acetaminophen (PERCOCET/ROXICET) 5-325 MG tablet  Every 4 hours PRN     09/30/18 0522           Note:  This document was prepared using Dragon voice recognition software and may include unintentional dictation errors.   Paulette Blanch, MD 09/30/18 [phone]

## 2019-09-24 ENCOUNTER — Encounter: Payer: Self-pay | Admitting: Emergency Medicine

## 2019-09-24 ENCOUNTER — Emergency Department
Admission: EM | Admit: 2019-09-24 | Discharge: 2019-09-24 | Disposition: A | Discharge status: Home / Self Care | Payer: Self-pay | Attending: Emergency Medicine | Admitting: Emergency Medicine

## 2019-09-24 ENCOUNTER — Other Ambulatory Visit: Payer: Self-pay

## 2019-09-24 DIAGNOSIS — Z79899 Other long term (current) drug therapy: Secondary | ICD-10-CM | POA: Insufficient documentation

## 2019-09-24 DIAGNOSIS — F17200 Nicotine dependence, unspecified, uncomplicated: Secondary | ICD-10-CM | POA: Insufficient documentation

## 2019-09-24 DIAGNOSIS — M10071 Idiopathic gout, right ankle and foot: Secondary | ICD-10-CM | POA: Insufficient documentation

## 2019-09-24 DIAGNOSIS — I10 Essential (primary) hypertension: Secondary | ICD-10-CM | POA: Insufficient documentation

## 2019-09-24 DIAGNOSIS — Z7982 Long term (current) use of aspirin: Secondary | ICD-10-CM | POA: Insufficient documentation

## 2019-09-24 LAB — CBC WITH DIFFERENTIAL/PLATELET
Abs Immature Granulocytes: 0.07 10*3/uL (ref 0.00–0.07)
Basophils Absolute: 0.1 10*3/uL (ref 0.0–0.1)
Basophils Relative: 1 %
Eosinophils Absolute: 0.3 10*3/uL (ref 0.0–0.5)
Eosinophils Relative: 3 %
HCT: 50.9 % (ref 39.0–52.0)
Hemoglobin: 17.3 g/dL — ABNORMAL HIGH (ref 13.0–17.0)
Immature Granulocytes: 1 %
Lymphocytes Relative: 15 %
Lymphs Abs: 1.5 10*3/uL (ref 0.7–4.0)
MCH: 32 pg (ref 26.0–34.0)
MCHC: 34 g/dL (ref 30.0–36.0)
MCV: 94.1 fL (ref 80.0–100.0)
Monocytes Absolute: 0.5 10*3/uL (ref 0.1–1.0)
Monocytes Relative: 5 %
Neutro Abs: 7.2 10*3/uL (ref 1.7–7.7)
Neutrophils Relative %: 75 %
Platelets: 211 10*3/uL (ref 150–400)
RBC: 5.41 MIL/uL (ref 4.22–5.81)
RDW: 12.1 % (ref 11.5–15.5)
WBC: 9.6 10*3/uL (ref 4.0–10.5)
nRBC: 0 % (ref 0.0–0.2)

## 2019-09-24 LAB — BASIC METABOLIC PANEL
Anion gap: 11 (ref 5–15)
BUN: 14 mg/dL (ref 6–20)
CO2: 26 mmol/L (ref 22–32)
Calcium: 9.5 mg/dL (ref 8.9–10.3)
Chloride: 101 mmol/L (ref 98–111)
Creatinine, Ser: 1.04 mg/dL (ref 0.61–1.24)
GFR calc Af Amer: >60 mL/min (ref >60)
GFR calc non Af Amer: >60 mL/min (ref >60)
Glucose, Bld: 122 mg/dL — ABNORMAL HIGH (ref 70–99)
Potassium: 4.6 mmol/L (ref 3.5–5.1)
Sodium: 138 mmol/L (ref 135–145)

## 2019-09-24 LAB — URIC ACID: Uric Acid, Serum: 9.2 mg/dL — ABNORMAL HIGH (ref 3.7–8.6)

## 2019-09-24 MED ORDER — COLCHICINE 0.6 MG PO TABS: 1.2000 mg | ORAL_TABLET | Freq: Once | ORAL | 0 refills | Status: DC

## 2019-09-24 MED ORDER — HYDROCODONE-ACETAMINOPHEN 5-325 MG PO TABS: 1.0000 | ORAL_TABLET | Freq: Three times a day (TID) | ORAL | 0 refills | Status: DC | PRN

## 2019-09-24 MED ORDER — NAPROXEN 500 MG PO TABS: 500.0000 mg | ORAL_TABLET | Freq: Two times a day (BID) | ORAL | 0 refills | Status: AC

## 2019-09-24 MED ORDER — HYDROCODONE-ACETAMINOPHEN 5-325 MG PO TABS
1.0000 | ORAL_TABLET | Freq: Once | ORAL | Status: AC
  Administered 2019-09-24: 1 via ORAL
  Filled 2019-09-24: qty 1

## 2019-09-24 MED ORDER — HYDROCHLOROTHIAZIDE 25 MG PO TABS: 25.0000 mg | ORAL_TABLET | Freq: Every day | ORAL | 0 refills | Status: DC

## 2019-09-24 MED ORDER — AMLODIPINE BESYLATE 10 MG PO TABS: 10.0000 mg | ORAL_TABLET | Freq: Every day | ORAL | 0 refills | Status: DC

## 2019-09-24 MED ORDER — AMLODIPINE BESYLATE 5 MG PO TABS
10.0000 mg | ORAL_TABLET | Freq: Once | ORAL | Status: AC
  Administered 2019-09-24: 10 mg via ORAL
  Filled 2019-09-24: qty 2

## 2019-09-24 NOTE — ED Notes (Signed)
First Nurse Note: Pt to ED via POV stating that he thinks he has gout. Pt is ambulatory without difficulty. Pt is in NAD.

## 2019-09-24 NOTE — ED Triage Notes (Signed)
Pt presents to ED via POV with c/o R foot swelling. Pt states he thinks he has gout based on what he has read online. Pt ambulatory with a slight limp. Pt noted to be hypertensive on arrival. Pt states has not been taking his BP medications for a while.

## 2019-09-24 NOTE — ED Provider Notes (Signed)
[facility] Emergency Department Provider Note ____________________________________________  Time seen: 1317  I have reviewed the triage vital signs and the nursing notes.  HISTORY  Chief Complaint  Foot Swelling  HPI Joseph Waters is a 44 y.o. male presents to the ED accompanied by his wife, for evaluation of a 3-week complaint of right foot pain.  Patient describes pain to the dorsum of the foot and the first MTP.  He reports similar symptoms a year earlier, and is suspicious for gout.  He has never been tested for gout and never been started on any antigas medications.  He denies any trauma to the foot, fever, chills, or sweats.  Patient also reports being out of his blood pressure medicine.  He denies any headache, chest pain, or shortness of breath.  He presents to the ED for evaluation of his symptoms.   Past Medical History:  Diagnosis Date  . Hypertension     There are no problems to display for this patient.   History reviewed. No pertinent surgical history.  Prior to Admission medications   Medication Sig Start Date End Date Taking? Authorizing Provider  amLODipine (NORVASC) 10 MG tablet Take 1 tablet (10 mg total) by mouth daily. 09/30/18   Irean Hong, MD  amLODipine (NORVASC) 10 MG tablet Take 1 tablet (10 mg total) by mouth daily. 09/24/19 10/24/19  Lynnley Doddridge, Charlesetta Ivory, PA-C  aspirin 325 MG tablet Take 325 mg by mouth daily.    [provider]  colchicine 0.6 MG tablet Take 2 tablets (1.2 mg total) by mouth once for 1 dose. Take 2 tabs now. May repeat with 1 tab in 1 hour. 09/24/19 09/24/19  Denissa Cozart, Charlesetta Ivory, PA-C  hydrochlorothiazide (HYDRODIURIL) 25 MG tablet Take 1 tablet (25 mg total) by mouth daily. For 14 days. 09/24/19 10/24/19  Laiya Wisby, Charlesetta Ivory, PA-C  HYDROcodone-acetaminophen (NORCO) 5-325 MG tablet Take 1 tablet by mouth 3 (three) times daily as needed. 09/24/19   Camaya Gannett, Charlesetta Ivory, PA-C  naproxen (NAPROSYN) 500  MG tablet Take 1 tablet (500 mg total) by mouth 2 (two) times daily with a meal for 15 days. 09/24/19 10/09/19  Maydell Knoebel, Charlesetta Ivory, PA-C  pravastatin (PRAVACHOL) 40 MG tablet Take 40 mg by mouth at bedtime. For 30 days. 08/24/18 09/24/19  [provider]    Allergies Patient has no known allergies.  History reviewed. No pertinent family history.  Social History Social History   Tobacco Use  . Smoking status: Current Every Day Smoker  . Smokeless tobacco: Never Used  Substance Use Topics  . Alcohol use: Yes  . Drug use: No    Review of Systems  Constitutional: Negative for fever. Cardiovascular: Negative for chest pain. Respiratory: Negative for shortness of breath. Gastrointestinal: Negative for abdominal pain, vomiting and diarrhea. Genitourinary: Negative for dysuria. Musculoskeletal: Negative for back pain. Right 1st MTP pain as above Skin: Negative for rash. Neurological: Negative for headaches, focal weakness or numbness. ____________________________________________  PHYSICAL EXAM:  VITAL SIGNS: ED Triage Vitals [09/24/19 1258]  Enc Vitals Group     BP (!) 212/106     Pulse Rate 90     Resp 20     Temp 99.1 F (37.3 C)     Temp Source Oral     SpO2 95 %     Weight (!) 400 lb (181.4 kg)     Height 6' (1.829 m)     Head Circumference      Peak  Flow      Pain Score 8     Pain Loc      Pain Edu?      Excl. in GC?     Constitutional: Alert and oriented. Well appearing and in no distress. Head: Normocephalic and atraumatic. Eyes: Conjunctivae are normal. Normal extraocular movements Cardiovascular: Normal rate, regular rhythm. Normal distal pulses. Respiratory: Normal respiratory effort. No wheezes/rales/rhonchi. Gastrointestinal: Soft and nontender. No distention. Musculoskeletal: Right foot without obvious deformity or dislocation.  Some mild soft tissue swelling noted.  Tender to palpation over the dorsal first MTP.  Nontender with normal range  of motion in all extremities.  Neurologic: Cranial nerves II through XII grossly intact.  Normal gross sensation noted. Normal speech and language. No gross focal neurologic deficits are appreciated. Skin:  Skin is warm, dry and intact. No rash noted. ____________________________________________   LABS (pertinent positives/negatives) Labs Reviewed  CBC WITH DIFFERENTIAL/PLATELET - Abnormal; Notable for the following components:      Result Value   Hemoglobin 17.3 (*)    All other components within normal limits  BASIC METABOLIC PANEL - Abnormal; Notable for the following components:   Glucose, Bld 122 (*)    All other components within normal limits  URIC ACID - Abnormal; Notable for the following components:   Uric Acid, Serum 9.2 (*)    All other components within normal limits  ____________________________________________  PROCEDURES  Norco 5-325 mg PO amlodipine 10 mg PO  Procedures ____________________________________________  INITIAL IMPRESSION / ASSESSMENT AND PLAN / ED COURSE  Patient with ED evaluation of acute right foot pain.  No obvious deformity or trauma requiring x-ray at this time.  Patient with a history of previous gout flare without a formal diagnosis.  Clinically he appears to have a gout flare.  Labs do confirm elevated uric acid level of 9.4.  Patient will be treated empirically with colchicine, Norco, and naproxen.  He is also given courtesy refills of his blood pressure medicine amlodipine, and HCTZ.  He will follow-up with his local provider or return to the ED as needed.  Joseph Waters was evaluated in Emergency Department on 09/24/2019 for the symptoms described in the history of present illness. He was evaluated in the context of the global COVID-19 pandemic, which necessitated consideration that the patient might be at risk for infection with the SARS-CoV-2 virus that causes COVID-19. Institutional protocols and algorithms that pertain to the evaluation of  patients at risk for COVID-19 are in a state of rapid change based on information released by regulatory bodies including the CDC and federal and state organizations. These policies and algorithms were followed during the patient's care in the ED.  I reviewed the patient's prescription history over the last 12 months in the multi-state controlled substances database(s) that includes [place], [place], [place], [place], [place], [place], [place], [place], [place], [place], [place], [place], [place], and [place].  Results were notable for no current RX.  ____________________________________________  FINAL CLINICAL IMPRESSION(S) / ED DIAGNOSES  Final diagnoses:  Acute idiopathic gout of right foot  Uncontrolled hypertension      Karmen Stabs, Charlesetta Ivory, PA-C 09/24/19 1902    Emily Filbert, MD 09/25/19 [phone]

## 2019-09-24 NOTE — ED Notes (Signed)
Declined discharge vital signs. 

## 2019-09-24 NOTE — Discharge Instructions (Addendum)
Your exam and labs are normal at this time, with the exception of an elevated uric acid level.  This indicates gout flare in your foot.  Take the prescription medications as prescribed for pain and inflammation.  Take the blood pressure medicine and fluid pill as directed.  Follow-up with local primary provider for continued management of gout.  Continue to hydrate regularly to avoid dehydration.

## 2020-11-02 ENCOUNTER — Observation Stay
Admission: EM | Admit: 2020-11-02 | Discharge: 2020-11-03 | Disposition: A | Discharge status: Home / Self Care | Payer: Self-pay | Attending: General Surgery | Admitting: General Surgery

## 2020-11-02 ENCOUNTER — Emergency Department: Payer: Self-pay

## 2020-11-02 ENCOUNTER — Other Ambulatory Visit: Payer: Self-pay

## 2020-11-02 ENCOUNTER — Observation Stay: Payer: Self-pay | Admitting: Registered Nurse

## 2020-11-02 ENCOUNTER — Encounter
Admission: EM | Disposition: A | Discharge status: Home / Self Care | Payer: Self-pay | Source: Home / Self Care | Attending: Emergency Medicine

## 2020-11-02 DIAGNOSIS — K439 Ventral hernia without obstruction or gangrene: Secondary | ICD-10-CM

## 2020-11-02 DIAGNOSIS — I10 Essential (primary) hypertension: Secondary | ICD-10-CM | POA: Insufficient documentation

## 2020-11-02 DIAGNOSIS — Z79899 Other long term (current) drug therapy: Secondary | ICD-10-CM | POA: Insufficient documentation

## 2020-11-02 DIAGNOSIS — Z20822 Contact with and (suspected) exposure to covid-19: Secondary | ICD-10-CM | POA: Insufficient documentation

## 2020-11-02 DIAGNOSIS — K430 Incisional hernia with obstruction, without gangrene: Principal | ICD-10-CM | POA: Diagnosis present

## 2020-11-02 DIAGNOSIS — Z7982 Long term (current) use of aspirin: Secondary | ICD-10-CM | POA: Insufficient documentation

## 2020-11-02 DIAGNOSIS — F17200 Nicotine dependence, unspecified, uncomplicated: Secondary | ICD-10-CM | POA: Insufficient documentation

## 2020-11-02 LAB — URINALYSIS, COMPLETE (UACMP) WITH MICROSCOPIC
Bacteria, UA: NONE SEEN
Bilirubin Urine: NEGATIVE
Glucose, UA: NEGATIVE mg/dL
Hgb urine dipstick: NEGATIVE
Ketones, ur: NEGATIVE mg/dL
Leukocytes,Ua: NEGATIVE
Nitrite: NEGATIVE
Protein, ur: NEGATIVE mg/dL
Specific Gravity, Urine: 1.023 (ref 1.005–1.030)
Squamous Epithelial / HPF: NONE SEEN (ref 0–5)
pH: 8 (ref 5.0–8.0)

## 2020-11-02 LAB — COMPREHENSIVE METABOLIC PANEL
ALT: 21 U/L (ref 0–44)
AST: 19 U/L (ref 15–41)
Albumin: 4 g/dL (ref 3.5–5.0)
Alkaline Phosphatase: 73 U/L (ref 38–126)
Anion gap: 7 (ref 5–15)
BUN: 17 mg/dL (ref 6–20)
CO2: 28 mmol/L (ref 22–32)
Calcium: 8.7 mg/dL — ABNORMAL LOW (ref 8.9–10.3)
Chloride: 99 mmol/L (ref 98–111)
Creatinine, Ser: 1.02 mg/dL (ref 0.61–1.24)
GFR, Estimated: >60 mL/min (ref >60)
Glucose, Bld: 132 mg/dL — ABNORMAL HIGH (ref 70–99)
Potassium: 4.3 mmol/L (ref 3.5–5.1)
Sodium: 134 mmol/L — ABNORMAL LOW (ref 135–145)
Total Bilirubin: 1.5 mg/dL — ABNORMAL HIGH (ref 0.3–1.2)
Total Protein: 7.3 g/dL (ref 6.5–8.1)

## 2020-11-02 LAB — LIPASE, BLOOD: Lipase: 30 U/L (ref 11–51)

## 2020-11-02 LAB — RESP PANEL BY RT-PCR (FLU A&B, COVID) ARPGX2
Influenza A by PCR: NEGATIVE
Influenza B by PCR: NEGATIVE
SARS Coronavirus 2 by RT PCR: NEGATIVE

## 2020-11-02 LAB — CBC
HCT: 49.9 % (ref 39.0–52.0)
Hemoglobin: 17.3 g/dL — ABNORMAL HIGH (ref 13.0–17.0)
MCH: 33.7 pg (ref 26.0–34.0)
MCHC: 34.7 g/dL (ref 30.0–36.0)
MCV: 97.1 fL (ref 80.0–100.0)
Platelets: 181 10*3/uL (ref 150–400)
RBC: 5.14 MIL/uL (ref 4.22–5.81)
RDW: 12.7 % (ref 11.5–15.5)
WBC: 12 10*3/uL — ABNORMAL HIGH (ref 4.0–10.5)
nRBC: 0 % (ref 0.0–0.2)

## 2020-11-02 SURGERY — REPAIR, HERNIA, UMBILICAL, ROBOT-ASSISTED
Anesthesia: General

## 2020-11-02 MED ORDER — ONDANSETRON 4 MG PO TBDP: 4.0000 mg | ORAL_TABLET | Freq: Four times a day (QID) | ORAL | Status: DC | PRN

## 2020-11-02 MED ORDER — PROPOFOL 500 MG/50ML IV EMUL
INTRAVENOUS | Status: AC
  Filled 2020-11-02: qty 50

## 2020-11-02 MED ORDER — AMLODIPINE BESYLATE 10 MG PO TABS
10.0000 mg | ORAL_TABLET | Freq: Every day | ORAL | Status: DC
  Administered 2020-11-03: 10 mg via ORAL
  Filled 2020-11-02: qty 1

## 2020-11-02 MED ORDER — MORPHINE SULFATE (PF) 4 MG/ML IV SOLN
4.0000 mg | Freq: Once | INTRAVENOUS | Status: AC
  Administered 2020-11-02: 4 mg via INTRAVENOUS
  Filled 2020-11-02: qty 1

## 2020-11-02 MED ORDER — DEXAMETHASONE SODIUM PHOSPHATE 10 MG/ML IJ SOLN
INTRAMUSCULAR | Status: DC | PRN
  Administered 2020-11-02: 10 mg via INTRAVENOUS

## 2020-11-02 MED ORDER — IOHEXOL 350 MG/ML SOLN
125.0000 mL | Freq: Once | INTRAVENOUS | Status: AC | PRN
  Administered 2020-11-02: 125 mL via INTRAVENOUS
  Filled 2020-11-02: qty 125

## 2020-11-02 MED ORDER — HYDROMORPHONE HCL 1 MG/ML IJ SOLN
0.5000 mg | INTRAMUSCULAR | Status: AC | PRN
  Administered 2020-11-02 (×2): 0.5 mg via INTRAVENOUS

## 2020-11-02 MED ORDER — FENTANYL CITRATE (PF) 100 MCG/2ML IJ SOLN
25.0000 ug | INTRAMUSCULAR | Status: DC | PRN
  Administered 2020-11-02: 25 ug via INTRAVENOUS
  Administered 2020-11-02: 50 ug via INTRAVENOUS

## 2020-11-02 MED ORDER — BUPIVACAINE-EPINEPHRINE (PF) 0.25% -1:200000 IJ SOLN
INTRAMUSCULAR | Status: AC
  Filled 2020-11-02: qty 30

## 2020-11-02 MED ORDER — DEXMEDETOMIDINE HCL IN NACL 200 MCG/50ML IV SOLN
INTRAVENOUS | Status: AC
  Filled 2020-11-02: qty 50

## 2020-11-02 MED ORDER — MORPHINE SULFATE (PF) 4 MG/ML IV SOLN
4.0000 mg | INTRAVENOUS | Status: DC | PRN
  Administered 2020-11-03: 4 mg via SUBCUTANEOUS
  Filled 2020-11-02: qty 1

## 2020-11-02 MED ORDER — LIDOCAINE HCL (CARDIAC) PF 100 MG/5ML IV SOSY
PREFILLED_SYRINGE | INTRAVENOUS | Status: DC | PRN
  Administered 2020-11-02: 100 mg via INTRAVENOUS

## 2020-11-02 MED ORDER — MIDAZOLAM HCL 2 MG/2ML IJ SOLN
INTRAMUSCULAR | Status: DC | PRN
Start: 2020-11-02 — End: 2020-11-02
  Administered 2020-11-02: 2 mg via INTRAVENOUS

## 2020-11-02 MED ORDER — ONDANSETRON HCL 4 MG/2ML IJ SOLN
4.0000 mg | Freq: Once | INTRAMUSCULAR | Status: AC
  Administered 2020-11-02: 4 mg via INTRAVENOUS
  Filled 2020-11-02: qty 2

## 2020-11-02 MED ORDER — ONDANSETRON HCL 4 MG/2ML IJ SOLN
INTRAMUSCULAR | Status: DC | PRN
  Administered 2020-11-02: 4 mg via INTRAVENOUS

## 2020-11-02 MED ORDER — ACETAMINOPHEN 650 MG RE SUPP: 650.0000 mg | Freq: Four times a day (QID) | RECTAL | Status: DC | PRN

## 2020-11-02 MED ORDER — SEVOFLURANE IN SOLN
RESPIRATORY_TRACT | Status: AC
  Filled 2020-11-02: qty 250

## 2020-11-02 MED ORDER — ACETAMINOPHEN 325 MG PO TABS: 650.0000 mg | ORAL_TABLET | Freq: Four times a day (QID) | ORAL | Status: DC | PRN

## 2020-11-02 MED ORDER — ENOXAPARIN SODIUM 40 MG/0.4ML IJ SOSY: 40.0000 mg | PREFILLED_SYRINGE | INTRAMUSCULAR | Status: DC

## 2020-11-02 MED ORDER — PANTOPRAZOLE SODIUM 40 MG IV SOLR
40.0000 mg | Freq: Every day | INTRAVENOUS | Status: DC
  Administered 2020-11-02: 40 mg via INTRAVENOUS
  Filled 2020-11-02: qty 40

## 2020-11-02 MED ORDER — ONDANSETRON HCL 4 MG/2ML IJ SOLN
INTRAMUSCULAR | Status: AC
  Filled 2020-11-02: qty 2

## 2020-11-02 MED ORDER — PHENYLEPHRINE HCL (PRESSORS) 10 MG/ML IV SOLN
INTRAVENOUS | Status: AC
  Filled 2020-11-02: qty 1

## 2020-11-02 MED ORDER — FENTANYL CITRATE (PF) 100 MCG/2ML IJ SOLN
INTRAMUSCULAR | Status: DC | PRN
  Administered 2020-11-02: 100 ug via INTRAVENOUS
  Administered 2020-11-02 (×2): 50 ug via INTRAVENOUS

## 2020-11-02 MED ORDER — LIDOCAINE HCL 4 % MT SOLN
OROMUCOSAL | Status: DC | PRN
  Administered 2020-11-02: 3 mL via TOPICAL

## 2020-11-02 MED ORDER — HYDROCODONE-ACETAMINOPHEN 5-325 MG PO TABS
ORAL_TABLET | ORAL | Status: AC
  Administered 2020-11-03: 2 via ORAL
  Filled 2020-11-02: qty 2

## 2020-11-02 MED ORDER — CEFAZOLIN SODIUM-DEXTROSE 2-4 GM/100ML-% IV SOLN
2.0000 g | Freq: Once | INTRAVENOUS | Status: AC
  Administered 2020-11-02: 2 g via INTRAVENOUS
  Filled 2020-11-02: qty 100

## 2020-11-02 MED ORDER — CEFAZOLIN SODIUM-DEXTROSE 1-4 GM/50ML-% IV SOLN
INTRAVENOUS | Status: DC | PRN
  Administered 2020-11-02: 1 g via INTRAVENOUS

## 2020-11-02 MED ORDER — ROCURONIUM BROMIDE 100 MG/10ML IV SOLN
INTRAVENOUS | Status: DC | PRN
  Administered 2020-11-02: 10 mg via INTRAVENOUS
  Administered 2020-11-02: 50 mg via INTRAVENOUS
  Administered 2020-11-02: 40 mg via INTRAVENOUS
  Administered 2020-11-02: 30 mg via INTRAVENOUS
  Administered 2020-11-02: 10 mg via INTRAVENOUS

## 2020-11-02 MED ORDER — SODIUM CHLORIDE 0.9 % IV SOLN: INTRAVENOUS | Status: DC

## 2020-11-02 MED ORDER — ONDANSETRON HCL 4 MG/2ML IJ SOLN: 4.0000 mg | Freq: Four times a day (QID) | INTRAMUSCULAR | Status: DC | PRN

## 2020-11-02 MED ORDER — FAMOTIDINE 20 MG PO TABS
ORAL_TABLET | ORAL | Status: AC
  Filled 2020-11-02: qty 1

## 2020-11-02 MED ORDER — 0.9 % SODIUM CHLORIDE (POUR BTL) OPTIME
TOPICAL | Status: DC | PRN
  Administered 2020-11-02: 500 mL

## 2020-11-02 MED ORDER — BUPIVACAINE-EPINEPHRINE (PF) 0.25% -1:200000 IJ SOLN
INTRAMUSCULAR | Status: DC | PRN
  Administered 2020-11-02: 8 mL
  Administered 2020-11-02: 12 mL

## 2020-11-02 MED ORDER — FENTANYL CITRATE (PF) 100 MCG/2ML IJ SOLN
INTRAMUSCULAR | Status: AC
  Administered 2020-11-02: 25 ug via INTRAVENOUS
  Filled 2020-11-02: qty 2

## 2020-11-02 MED ORDER — FENTANYL CITRATE (PF) 100 MCG/2ML IJ SOLN
INTRAMUSCULAR | Status: AC
  Administered 2020-11-02: 50 ug via INTRAVENOUS
  Filled 2020-11-02: qty 2

## 2020-11-02 MED ORDER — HYDROMORPHONE HCL 1 MG/ML IJ SOLN
INTRAMUSCULAR | Status: AC
  Administered 2020-11-02: 0.5 mg via INTRAVENOUS
  Filled 2020-11-02: qty 1

## 2020-11-02 MED ORDER — ROCURONIUM BROMIDE 10 MG/ML (PF) SYRINGE
PREFILLED_SYRINGE | INTRAVENOUS | Status: AC
  Filled 2020-11-02: qty 10

## 2020-11-02 MED ORDER — MIDAZOLAM HCL 2 MG/2ML IJ SOLN
INTRAMUSCULAR | Status: AC
  Filled 2020-11-02: qty 2

## 2020-11-02 MED ORDER — ONDANSETRON HCL 4 MG/2ML IJ SOLN: 4.0000 mg | Freq: Once | INTRAMUSCULAR | Status: DC | PRN

## 2020-11-02 MED ORDER — SUCCINYLCHOLINE CHLORIDE 200 MG/10ML IV SOSY
PREFILLED_SYRINGE | INTRAVENOUS | Status: DC | PRN
  Administered 2020-11-02: 200 mg via INTRAVENOUS

## 2020-11-02 MED ORDER — PROPOFOL 10 MG/ML IV BOLUS
INTRAVENOUS | Status: DC | PRN
Start: 2020-11-02 — End: 2020-11-02
  Administered 2020-11-02: 200 mg via INTRAVENOUS

## 2020-11-02 MED ORDER — SUGAMMADEX SODIUM 500 MG/5ML IV SOLN
INTRAVENOUS | Status: DC | PRN
  Administered 2020-11-02: 500 mg via INTRAVENOUS

## 2020-11-02 MED ORDER — FENTANYL CITRATE (PF) 100 MCG/2ML IJ SOLN
INTRAMUSCULAR | Status: AC
  Filled 2020-11-02: qty 2

## 2020-11-02 MED ORDER — ACETAMINOPHEN 10 MG/ML IV SOLN
INTRAVENOUS | Status: AC
  Filled 2020-11-02: qty 100

## 2020-11-02 MED ORDER — CHLORHEXIDINE GLUCONATE 0.12 % MT SOLN
OROMUCOSAL | Status: AC
  Filled 2020-11-02: qty 15

## 2020-11-02 MED ORDER — HYDROCODONE-ACETAMINOPHEN 5-325 MG PO TABS
1.0000 | ORAL_TABLET | ORAL | Status: DC | PRN
  Administered 2020-11-02: 2 via ORAL
  Administered 2020-11-02 – 2020-11-03 (×2): 1 via ORAL
  Filled 2020-11-02 (×2): qty 2
  Filled 2020-11-02: qty 1

## 2020-11-02 SURGICAL SUPPLY — 56 items
BAG INFUSER PRESSURE 100CC (MISCELLANEOUS) IMPLANT
BINDER ABDOMINAL 12 ML 46-62 (SOFTGOODS) ×4 IMPLANT
BLADE SURG SZ11 CARB STEEL (BLADE) ×2 IMPLANT
CANNULA REDUC XI 12-8 STAPL (CANNULA) ×1
CANNULA REDUCER 12-8 DVNC XI (CANNULA) ×1 IMPLANT
CHLORAPREP W/TINT 26 (MISCELLANEOUS) ×4 IMPLANT
COVER TIP SHEARS 8 DVNC (MISCELLANEOUS) ×1 IMPLANT
COVER TIP SHEARS 8MM DA VINCI (MISCELLANEOUS) ×1
DEFOGGER SCOPE WARMER CLEARIFY (MISCELLANEOUS) ×2 IMPLANT
DERMABOND ADVANCED (GAUZE/BANDAGES/DRESSINGS) ×1
DERMABOND ADVANCED .7 DNX12 (GAUZE/BANDAGES/DRESSINGS) ×1 IMPLANT
DRAPE ARM DVNC X/XI (DISPOSABLE) ×3 IMPLANT
DRAPE COLUMN DVNC XI (DISPOSABLE) ×1 IMPLANT
DRAPE DA VINCI XI ARM (DISPOSABLE) ×3
DRAPE DA VINCI XI COLUMN (DISPOSABLE) ×1
ELECT REM PT RETURN 9FT ADLT (ELECTROSURGICAL) ×2
ELECTRODE REM PT RTRN 9FT ADLT (ELECTROSURGICAL) ×1 IMPLANT
GAUZE 4X4 16PLY ~~LOC~~+RFID DBL (SPONGE) ×2 IMPLANT
GLOVE SURG ENC MOIS LTX SZ6.5 (GLOVE) ×4 IMPLANT
GLOVE SURG UNDER POLY LF SZ6.5 (GLOVE) ×4 IMPLANT
GOWN STRL REUS W/ TWL LRG LVL3 (GOWN DISPOSABLE) ×3 IMPLANT
GOWN STRL REUS W/TWL LRG LVL3 (GOWN DISPOSABLE) ×3
GRASPER SUT TROCAR 14GX15 (MISCELLANEOUS) ×2 IMPLANT
IRRIGATOR SUCT 8 DISP DVNC XI (IRRIGATION / IRRIGATOR) IMPLANT
IRRIGATOR SUCTION 8MM XI DISP (IRRIGATION / IRRIGATOR)
IV NS 1000ML (IV SOLUTION)
IV NS 1000ML BAXH (IV SOLUTION) IMPLANT
KIT PINK PAD W/HEAD ARE REST (MISCELLANEOUS) ×2 IMPLANT
KIT PINK PAD W/HEAD ARM REST (MISCELLANEOUS) ×1 IMPLANT
LABEL OR SOLS (LABEL) ×2 IMPLANT
MANIFOLD NEPTUNE II (INSTRUMENTS) IMPLANT
MESH VENTRALIGHT ST 6X8 (Mesh Specialty) ×1 IMPLANT
MESH VENTRLGHT ELLIPSE 8X6XMFL (Mesh Specialty) ×1 IMPLANT
NEEDLE HYPO 22GX1.5 SAFETY (NEEDLE) ×2 IMPLANT
NEEDLE INSUFFLATION 14GA 120MM (NEEDLE) ×2 IMPLANT
NS IRRIG 500ML POUR BTL (IV SOLUTION) ×2 IMPLANT
OBTURATOR OPTICAL STANDARD 8MM (TROCAR) ×1
OBTURATOR OPTICAL STND 8 DVNC (TROCAR) ×1
OBTURATOR OPTICALSTD 8 DVNC (TROCAR) ×1 IMPLANT
PACK LAP CHOLECYSTECTOMY (MISCELLANEOUS) ×2 IMPLANT
SEAL CANN UNIV 5-8 DVNC XI (MISCELLANEOUS) ×3 IMPLANT
SEAL XI 5MM-8MM UNIVERSAL (MISCELLANEOUS) ×3
SET TUBE SMOKE EVAC HIGH FLOW (TUBING) ×2 IMPLANT
SOLUTION ELECTROLUBE (MISCELLANEOUS) ×2 IMPLANT
SPONGE T-LAP 4X18 ~~LOC~~+RFID (SPONGE) ×2 IMPLANT
STAPLER CANNULA SEAL DVNC XI (STAPLE) ×1 IMPLANT
STAPLER CANNULA SEAL XI (STAPLE) ×1
SUT MNCRL 4-0 (SUTURE) ×1
SUT MNCRL 4-0 27XMFL (SUTURE) ×1
SUT STRATAFIX PDS 30 CT-1 (SUTURE) ×2 IMPLANT
SUT V-LOC 90 ABS 3-0 VLT  V-20 (SUTURE) ×1
SUT V-LOC 90 ABS 3-0 VLT V-20 (SUTURE) ×1 IMPLANT
SUT VICRYL 0 AB UR-6 (SUTURE) ×2 IMPLANT
SUT VLOC 90 S/L VL9 GS22 (SUTURE) ×4 IMPLANT
SUTURE MNCRL 4-0 27XMF (SUTURE) ×1 IMPLANT
WATER STERILE IRR 500ML POUR (IV SOLUTION) IMPLANT

## 2020-11-02 NOTE — Anesthesia Preprocedure Evaluation (Addendum)
Anesthesia Evaluation  Patient identified by MRN, date of birth, ID band Patient awake    Reviewed: Allergy & Precautions, H&P , NPO status , Patient's Chart, lab work & pertinent test results, reviewed documented beta blocker date and time   Airway Mallampati: III  TM Distance: >3 FB Neck ROM: full    Dental  (+) Teeth Intact   Pulmonary neg pulmonary ROS, Current SmokerPatient did not abstain from smoking.,  Pt likely has untreated sleep apnea, undiagnosed. ja   Pulmonary exam normal        Cardiovascular Exercise Tolerance: Good hypertension, (-) angina(-) CAD, (-) Past MI, (-) Orthopnea and (-) PND negative cardio ROS Normal cardiovascular exam Rhythm:regular Rate:Normal  Pt. Denies HTN... claims borderline status.      Neuro/Psych negative neurological ROS  negative psych ROS   GI/Hepatic negative GI ROS, Neg liver ROS,   Endo/Other  Morbid obesity  Renal/GU negative Renal ROS  negative genitourinary   Musculoskeletal   Abdominal   Peds  Hematology negative hematology ROS (+)   Anesthesia Other Findings Past Medical History: No date: Hypertension History reviewed. No pertinent surgical history. BMI    Body Mass Index: 54.24 kg/m     Reproductive/Obstetrics negative OB ROS                            Anesthesia Physical Anesthesia Plan  ASA: 3  Anesthesia Plan: General ETT   Post-op Pain Management:    Induction:   PONV Risk Score and Plan: 2  Airway Management Planned: Video Laryngoscope Planned and Oral ETT  Additional Equipment:   Intra-op Plan:   Post-operative Plan:   Informed Consent: I have reviewed the patients History and Physical, chart, labs and discussed the procedure including the risks, benefits and alternatives for the proposed anesthesia with the patient or authorized representative who has indicated his/her understanding and acceptance.     Dental  Advisory Given  Plan Discussed with: CRNA  Anesthesia Plan Comments:         Anesthesia Quick Evaluation

## 2020-11-02 NOTE — ED Triage Notes (Signed)
Pt here with abd pain. Pt has a large abd hernia that has gotten worse over the past 2 days. Pt states nausea and diarrhea but has not vomited.

## 2020-11-02 NOTE — ED Notes (Signed)
Pt is not on blood pressure medication.

## 2020-11-02 NOTE — ED Notes (Signed)
Pt undressed and placed in gown. 

## 2020-11-02 NOTE — Transfer of Care (Signed)
Immediate Anesthesia Transfer of Care Note  Patient: Joseph Waters  Procedure(s) Performed: XI ROBOT ASSISTED UMBILICAL HERNIA REPAIR  Patient Location: PACU  Anesthesia Type:General  Level of Consciousness: sedated  Airway & Oxygen Therapy: Patient Spontanous Breathing and Patient connected to face mask oxygen  Post-op Assessment: Report given to RN and Post -op Vital signs reviewed and stable  Post vital signs: Reviewed and stable  Last Vitals:  Vitals Value Taken Time  BP 151/91 11/02/20 1758  Temp    Pulse 80 11/02/20 1759  Resp 13 11/02/20 1759  SpO2 95 % 11/02/20 1759  Vitals shown include unvalidated device data.  Last Pain:  Vitals:   11/02/20 1758  TempSrc:   PainSc: 0-No pain      Patients Stated Pain Goal: 0 (11/02/20 1456)  Complications: No notable events documented.

## 2020-11-02 NOTE — Anesthesia Procedure Notes (Signed)
Procedure Name: Intubation Date/Time: 11/02/2020 3:30 PM Performed by: Karoline Caldwell, CRNA Pre-anesthesia Checklist: Patient identified, Patient being monitored, Timeout performed, Emergency Drugs available and Suction available Patient Re-evaluated:Patient Re-evaluated prior to induction Oxygen Delivery Method: Circle system utilized Preoxygenation: Pre-oxygenation with 100% oxygen Induction Type: IV induction Ventilation: Two handed mask ventilation required and Nasal airway inserted- appropriate to patient size Laryngoscope Size: 4 and McGraph Grade View: Grade I Tube type: Oral Tube size: 7.5 mm Number of attempts: 1 Airway Equipment and Method: Stylet Placement Confirmation: ETT inserted through vocal cords under direct vision, positive ETCO2 and breath sounds checked- equal and bilateral Secured at: 23 cm Tube secured with: Tape Dental Injury: Teeth and Oropharynx as per pre-operative assessment  Difficulty Due To: Difficulty was anticipated, Difficult Airway- due to large tongue and Difficult Airway- due to limited oral opening

## 2020-11-02 NOTE — H&P (Signed)
SURGICAL HISTORY AND PHYSICAL NOTE   HISTORY OF PRESENT ILLNESS (HPI):  45 y.o. male presented to [facility] ED for evaluation of abdominal pain since yesterday night. Patient reports he has been having abdominal pain since last nigh after doing heavy lifting at home. Pain acute and severe. Pain on the supra umbilical area. No pain radiation. Associated nausea. No alleviating factors. Aggravating factor is palpating the lump in the supra umbilical area.   At the ED he was found with leukocytosis. CT of the abdomens shows incarcerated fat containing umbilical hernia. Pain severe and surrounding area with cellulitis. Pain not improved with morphine.   Surgery is consulted by Dr. Cyril Loosen in this context for evaluation and management of incarcerated incisional hernia.  PAST MEDICAL HISTORY (PMH):  Past Medical History:  Diagnosis Date   Hypertension      PAST SURGICAL HISTORY (PSH):  No past surgical history on file.   MEDICATIONS:  Prior to Admission medications   Medication Sig Start Date End Date Taking? Authorizing Provider  amLODipine (NORVASC) 10 MG tablet Take 1 tablet (10 mg total) by mouth daily. 09/30/18   Irean Hong, MD  amLODipine (NORVASC) 10 MG tablet Take 1 tablet (10 mg total) by mouth daily. 09/24/19 10/24/19  Menshew, Charlesetta Ivory, PA-C  aspirin 325 MG tablet Take 325 mg by mouth daily.    [provider]  colchicine 0.6 MG tablet Take 2 tablets (1.2 mg total) by mouth once for 1 dose. Take 2 tabs now. May repeat with 1 tab in 1 hour. 09/24/19 09/24/19  Menshew, Charlesetta Ivory, PA-C  hydrochlorothiazide (HYDRODIURIL) 25 MG tablet Take 1 tablet (25 mg total) by mouth daily. For 14 days. 09/24/19 10/24/19  Menshew, Charlesetta Ivory, PA-C  HYDROcodone-acetaminophen (NORCO) 5-325 MG tablet Take 1 tablet by mouth 3 (three) times daily as needed. 09/24/19   Menshew, Charlesetta Ivory, PA-C  pravastatin (PRAVACHOL) 40 MG tablet Take 40 mg by mouth at bedtime. For 30 days. 08/24/18 09/24/19   [provider]     ALLERGIES:  No Known Allergies   SOCIAL HISTORY:  Social History   Socioeconomic History   Marital status: Married    Spouse name: Not on file   Number of children: Not on file   Years of education: Not on file   Highest education level: Not on file  Occupational History   Not on file  Tobacco Use   Smoking status: Every Day   Smokeless tobacco: Never  Substance and Sexual Activity   Alcohol use: Yes   Drug use: No   Sexual activity: Not on file  Other Topics Concern   Not on file  Social History Narrative   Not on file   Social Determinants of Health   Financial Resource Strain: Not on file  Food Insecurity: Not on file  Transportation Needs: Not on file  Physical Activity: Not on file  Stress: Not on file  Social Connections: Not on file  Intimate Partner Violence: Not on file      FAMILY HISTORY:  No family history on file.   REVIEW OF SYSTEMS:  Constitutional: denies weight loss, fever, chills, or sweats  Eyes: denies any other vision changes, history of eye injury  ENT: denies sore throat, hearing problems  Respiratory: denies shortness of breath, wheezing  Cardiovascular: denies chest pain, palpitations  Gastrointestinal: positive abdominal pain, nausea Genitourinary: denies burning with urination or urinary frequency Musculoskeletal: denies any other joint pains or cramps  Skin: denies  any other rashes or skin discolorations  Neurological: denies any other headache, dizziness, weakness  Psychiatric: denies any other depression, anxiety   All other review of systems were negative   VITAL SIGNS:  Temp:  [98.8 F (37.1 C)] 98.8 F (37.1 C) (09/02 1039) Pulse Rate:  [85-95] 85 (09/02 1338) Resp:  [18-20] 18 (09/02 1338) BP: (145-184)/(92-111) 145/92 (09/02 1338) SpO2:  [93 %-95 %] 93 % (09/02 1338) Weight:  [181.4 kg] 181.4 kg (09/02 1040)     Height: 6' (182.9 cm) Weight: (!) 181.4 kg BMI (Calculated): 54.24    INTAKE/OUTPUT:  This shift: No intake/output data recorded.  Last 2 shifts: @IOLAST2SHIFTS @   PHYSICAL EXAM:  Constitutional:  -- Morbidly obese  -- Awake, alert, and oriented x3  Eyes:  -- Pupils equally round and reactive to light  -- No scleral icterus  Ear, nose, and throat:  -- No jugular venous distension  Pulmonary:  -- No crackles  -- Equal breath sounds bilaterally -- Breathing non-labored at rest Cardiovascular:  -- S1, S2 present  -- No pericardial rubs Gastrointestinal:  -- Abdomen ventral hernia, large lump, with associated cellulitis. Very tender to palpation.  -- No abdominal masses appreciated, pulsatile or otherwise  Musculoskeletal and Integumentary:  -- Wounds: None appreciated -- Extremities: B/L UE and LE FROM, hands and feet warm, no edema  Neurologic:  -- Motor function: intact and symmetric -- Sensation: intact and symmetric   Labs:  CBC Latest Ref Rng & Units 11/02/2020 09/24/2019 03/11/2017  WBC 4.0 - 10.5 K/uL 12.0(H) 9.6 9.8  Hemoglobin 13.0 - 17.0 g/dL 17.3(H) 17.3(H) 16.3  Hematocrit 39.0 - 52.0 % 49.9 50.9 48.0  Platelets 150 - 400 K/uL 181 211 205   CMP Latest Ref Rng & Units 11/02/2020 09/24/2019 03/11/2017  Glucose 70 - 99 mg/dL 05/09/2017) 032(Z) 224(M)  BUN 6 - 20 mg/dL 17 14 250(I)  Creatinine 0.61 - 1.24 mg/dL 37(C 4.88 8.91)  Sodium 135 - 145 mmol/L 134(L) 138 138  Potassium 3.5 - 5.1 mmol/L 4.3 4.6 4.4  Chloride 98 - 111 mmol/L 99 101 104  CO2 22 - 32 mmol/L 28 26 26   Calcium 8.9 - 10.3 mg/dL 6.94(H) 9.5 9.2  Total Protein 6.5 - 8.1 g/dL 7.3 - 7.1  Total Bilirubin 0.3 - 1.2 mg/dL ) - 1.0  Alkaline Phos 38 - 126 U/L 73 - 71  AST 15 - 41 U/L 19 - 32  ALT 0 - 44 U/L 21 - 27    Imaging studies:  CT with incarcerated fat at incisional hernia. I personally evaluated the images.   Assessment/Plan:  45 y.o. male with incarcerated umbilical hernia, complicated by pertinent comorbidities including morbid obesity,  hypertension.  Patient with incarcerated fat incisional hernia from previous cholecystectomy. There are changes of cellulitis and very tender to palpation concerning of possible strangulation. Patient oriented that due to this finding, surgical repair was recommended. He was also oriented that due to weight the risk of recurrence is extremely high. At this moment benefits of surgical reduction and repair of incisional hernia are higher than risk of recurrence. Patient understand also risk of injury other organ or intestine, bleeding, infecition, among others. Patient consented to proceed with surgical repair of incisional hernia. Wife present during discussion.   8.8(K, MD

## 2020-11-02 NOTE — Anesthesia Procedure Notes (Signed)
Procedure Name: Intubation Date/Time: 11/02/2020 3:29 PM Performed by: Karoline Caldwell, CRNA Pre-anesthesia Checklist: Patient identified, Emergency Drugs available, Suction available and Patient being monitored Patient Re-evaluated:Patient Re-evaluated prior to induction Oxygen Delivery Method: Circle system utilized Preoxygenation: Pre-oxygenation with 100% oxygen Induction Type: IV induction Ventilation: Two handed mask ventilation required and Oral airway inserted - appropriate to patient size Laryngoscope Size: McGraph and 4 Grade View: Grade I Tube type: Oral Number of attempts: 1 Airway Equipment and Method: Stylet, Oral airway and Video-laryngoscopy Placement Confirmation: ETT inserted through vocal cords under direct vision, positive ETCO2 and breath sounds checked- equal and bilateral Secured at: 22 cm Tube secured with: Tape Dental Injury: Teeth and Oropharynx as per pre-operative assessment

## 2020-11-02 NOTE — Op Note (Signed)
Preoperative diagnosis: Incarcerated Incisional Hernia  Postoperative diagnosis: Strangulated incisional Hernia  Procedure: Robotic assisted laparoscopic strangulated incisional hernia repair with mesh  Anesthesia: General  Surgeon: Dr. Hazle Quant  Wound Classification: Clean  Specimen: None  Complications: None  Estimated Blood Loss: 16ml  Indications: Patient is a 45 y.o. male developed a incisional hernia. This was acutely incarcerated with concern of strangulation and repair was indicated.   Findings: Two incisional hernias.  The supraumbilical measured 2 x 2 cm with a 2 cm separation of cephalad 1 cm incisional hernia.   2.  Partially strangulated omental fat was incarcerated.  There was abundant amount of omental fat incarcerated through the small hernia defect with huge hernia sac.    3. Repair achieved with closure of the anterior fascia at midline and 20 x 25 cm Ventralight Bard mesh   4. Adequate hemostasis  Description of procedure: The patient was brought to the operating room and general anesthesia was induced. A time-out was completed verifying correct patient, procedure, site, positioning, and implant(s) and/or special equipment prior to beginning this procedure. Antibiotics were administered prior to making the incision. SCDs placed. The anterior abdominal wall was prepped and draped in the standard sterile fashion.   Palmer's point chosen for entry.  Veress needle placed and abdomen insufflated to 15cm without any dramatic increase in pressure.  Needle removed and optiview technique used to place 26mm port at same point.  No injury noted during placement. Two additional ports, one 8 mm on the epigastric area and another 12 mm on the left abdomen were placed.  Xi robot then docked into place.  Hernia contents noted and reduced with combination of blunt, sharp dissection with scissors and bipolar forceps.  This was a very difficult and time-consuming part of the  procedure due to the excessive amount of omental fat incarcerated in the hernia.  It was also time-consuming because there were patches of omental fat that were needed to be resected.  Hemostasis achieved throughout this portion.  Once all hernia contents reduced, there was noted to be a 2 incisional hernias as described above.    Insufflation dropped to 69mm and transfacial suture with 0 stratafix used to primarily close defect under minimal tension.  Ventral light mesh was placed within the abdominal cavity through 32mm port and secured to the abdominal wall centered over the defect using the 0 stratafix previously used to primarily close defect.  The mesh was then circumferentially sutured into the anterior abdominal wall using 2-0 VLock x3.  Any bleeding noted during this portion was no longer actively bleeding by end of securing mesh and tightening the suture.    Robot was undocked.  The 61mm cannula was removed and port site was closed using PMI device and 0 vicryl suture, ensuring no bowels were injured during this process.  Abdomen then desufflated while camera within abdomen to ensure no signs of new bleed prior to removing camera and rest of ports completely.  All skin incisions closed with runninrg 4-0 Monocryl in a subcuticular fashion.  All wounds then dressed with Dermabond.  Patient was then successfully awakened and transferred to PACU in stable condition.  At the end of the procedure sponge and instrument counts were correct.

## 2020-11-02 NOTE — ED Provider Notes (Signed)
[facility] Emergency Department Provider Note   ____________________________________________    I have reviewed the triage vital signs and the nursing notes.   HISTORY  Chief Complaint Abdominal Pain and Nausea     HPI Joseph Waters is a 45 y.o. male who presents with complaints of abdominal pain and mild nausea.  Patient describes having a ventral hernia for quite some time, over the last 24 hours has become painful and burning in nature.  He reports it is tender to the touch.  Denies fevers or chills.  Has not take anything for this.  Believes that he is still passing gas.  Past Medical History:  Diagnosis Date   Hypertension     There are no problems to display for this patient.   No past surgical history on file.  Prior to Admission medications   Medication Sig Start Date End Date Taking? Authorizing Provider  amLODipine (NORVASC) 10 MG tablet Take 1 tablet (10 mg total) by mouth daily. 09/30/18   Irean Hong, MD  amLODipine (NORVASC) 10 MG tablet Take 1 tablet (10 mg total) by mouth daily. 09/24/19 10/24/19  Menshew, Charlesetta Ivory, PA-C  aspirin 325 MG tablet Take 325 mg by mouth daily.    [provider]  colchicine 0.6 MG tablet Take 2 tablets (1.2 mg total) by mouth once for 1 dose. Take 2 tabs now. May repeat with 1 tab in 1 hour. 09/24/19 09/24/19  Menshew, Charlesetta Ivory, PA-C  hydrochlorothiazide (HYDRODIURIL) 25 MG tablet Take 1 tablet (25 mg total) by mouth daily. For 14 days. 09/24/19 10/24/19  Menshew, Charlesetta Ivory, PA-C  HYDROcodone-acetaminophen (NORCO) 5-325 MG tablet Take 1 tablet by mouth 3 (three) times daily as needed. 09/24/19   Menshew, Charlesetta Ivory, PA-C  pravastatin (PRAVACHOL) 40 MG tablet Take 40 mg by mouth at bedtime. For 30 days. 08/24/18 09/24/19  [provider]     Allergies Patient has no known allergies.  No family history on file.  Social History Social History   Tobacco Use   Smoking  status: Every Day   Smokeless tobacco: Never  Substance Use Topics   Alcohol use: Yes   Drug use: No    Review of Systems  Constitutional: No fever/chills Eyes: No visual changes.  ENT: No sore throat. Cardiovascular: Denies chest pain. Respiratory: Denies shortness of breath. Gastrointestinal: As above Genitourinary: Negative for dysuria. Musculoskeletal: Negative for back pain. Skin: Negative for rash. Neurological: Negative for headaches or weakness   ____________________________________________   PHYSICAL EXAM:  VITAL SIGNS: ED Triage Vitals  Enc Vitals Group     BP 11/02/20 1039 (!) 184/111     Pulse Rate 11/02/20 1039 95     Resp 11/02/20 1039 20     Temp 11/02/20 1039 98.8 F (37.1 C)     Temp Source 11/02/20 1039 Oral     SpO2 11/02/20 1039 95 %     Weight 11/02/20 1040 (!) 181.4 kg (400 lb)     Height 11/02/20 1040 1.829 m (6')     Head Circumference --      Peak Flow --      Pain Score 11/02/20 1040 8     Pain Loc --      Pain Edu? --      Excl. in GC? --     Constitutional: Alert and oriented. No acute distress. Pleasant and interactive  Nose: No congestion/rhinnorhea. Mouth/Throat: Mucous membranes are moist.   Neck:  Painless ROM Cardiovascular: Normal rate, regular rhythm.  Good peripheral circulation. Respiratory: Normal respiratory effort.  No retractions. Gastrointestinal: Soft, no significant distention, ventral hernia noted bulging, unable to reduce and tender to palpation  Musculoskeletal: Warm and well perfused Neurologic:  Normal speech and language. No gross focal neurologic deficits are appreciated.  Skin:  Skin is warm, dry and intact. No rash noted. Psychiatric: Mood and affect are normal. Speech and behavior are normal.  ____________________________________________   LABS (all labs ordered are listed, but only abnormal results are displayed)  Labs Reviewed  COMPREHENSIVE METABOLIC PANEL - Abnormal; Notable for the following  components:      Result Value   Sodium 134 (*)    Glucose, Bld 132 (*)    Calcium 8.7 (*)    Total Bilirubin 1.5 (*)    All other components within normal limits  CBC - Abnormal; Notable for the following components:   WBC 12.0 (*)    Hemoglobin 17.3 (*)    All other components within normal limits  URINALYSIS, COMPLETE (UACMP) WITH MICROSCOPIC - Abnormal; Notable for the following components:   Color, Urine YELLOW (*)    APPearance CLEAR (*)    All other components within normal limits  RESP PANEL BY RT-PCR (FLU A&B, COVID) ARPGX2  LIPASE, BLOOD   ____________________________________________  EKG   ____________________________________________  RADIOLOGY  CT abdomen pelvis reviewed by me ____________________________________________   PROCEDURES  Procedure(s) performed: No  Procedures   Critical Care performed: No ____________________________________________   INITIAL IMPRESSION / ASSESSMENT AND PLAN / ED COURSE  Pertinent labs & imaging results that were available during my care of the patient were reviewed by me and considered in my medical decision making (see chart for details).   Patient overall well-appearing, here for abdominal pain as described above. Ventral hernia that is large and tender. Unable to reduce at bedside, will send for CT give IV analgesics  CT demonstrates large periumbilical hernia, discussed with Dr. Maia Plan, greatly appreciate him consulting  Dr. Maia Plan will admit the patient for surgery, patient is n.p.o., will obtain COVID swab    ____________________________________________   FINAL CLINICAL IMPRESSION(S) / ED DIAGNOSES  Final diagnoses:  Ventral hernia without obstruction or gangrene        Note:  This document was prepared using Dragon voice recognition software and may include unintentional dictation errors.    Jene Every, MD 11/02/20 [phone]

## 2020-11-03 MED ORDER — ENOXAPARIN SODIUM 100 MG/ML IJ SOSY
0.5000 mg/kg | PREFILLED_SYRINGE | INTRAMUSCULAR | Status: DC
  Administered 2020-11-03: 90 mg via SUBCUTANEOUS
  Filled 2020-11-03: qty 0.9

## 2020-11-03 MED ORDER — HYDROCODONE-ACETAMINOPHEN 5-325 MG PO TABS: 1.0000 | ORAL_TABLET | ORAL | 0 refills | Status: AC | PRN

## 2020-11-03 NOTE — Progress Notes (Signed)
PHARMACIST - PHYSICIAN COMMUNICATION  CONCERNING:  Enoxaparin (Lovenox) for DVT Prophylaxis    RECOMMENDATION: Patient was prescribed enoxaprin 40mg  q24 hours for VTE prophylaxis.   Filed Weights   11/02/20 1040 11/02/20 1456  Weight: (!) 181.4 kg (400 lb) (!) 181.4 kg (399 lb 14.6 oz)    Body mass index is 54.24 kg/m.  Estimated Creatinine Clearance: 155.7 mL/min (by C-G formula based on SCr of 1.02 mg/dL).   Based on [facility] policy patient is candidate for enoxaparin 0.5mg /kg TBW SQ every 24 hours based on BMI being >30.  DESCRIPTION: Pharmacy has adjusted enoxaparin dose per [facility] policy.  Patient is now receiving enoxaparin 0.5 mg/kg every 24 hours    [facility], PharmD Clinical Pharmacist  11/03/2020 8:16 AM

## 2020-11-03 NOTE — Discharge Instructions (Signed)

## 2020-11-03 NOTE — Progress Notes (Signed)
Patient given instructions to make follow up appointment, when to return for worsening symptoms, 2 Ivs removed, Norco education given, Abdominal binder in use, & discharged via wheelchair.

## 2020-11-03 NOTE — Discharge Summary (Signed)
  Patient ID: Joseph Waters MRN: [id number] DOB/AGE: 05/04/1975 45 y.o.  Admit date: 11/02/2020 Discharge date: 11/03/2020   Discharge Diagnoses:  Active Problems:   Incarcerated incisional hernia   Procedures: Robotic assisted laparoscopic incisional strangulated hernia repair with mesh   Hospital Course: Patient with incarcerated incisional hernia.  He underwent robotic assisted laparoscopic incisional hernia repair.  There was some areas of the omental fat that were strangulated.  Patient has been recovering well.  This morning with pain control.  Tolerating diet.  Wounds are dry and clean.  Physical Exam HENT:     Head: Normocephalic.  Cardiovascular:     Rate and Rhythm: Normal rate and regular rhythm.  Pulmonary:     Effort: Pulmonary effort is normal.     Breath sounds: Normal breath sounds.  Abdominal:     General: Abdomen is flat. Bowel sounds are normal.     Palpations: Abdomen is soft.     Tenderness: There is no abdominal tenderness.  Neurological:     Mental Status: He is alert.  Wounds are dry and clean  Consults: None  Disposition: Discharge disposition: 01-Home or Self Care       Discharge Instructions     Diet - low sodium heart healthy   Complete by: As directed       Allergies as of 11/03/2020   No Known Allergies      Medication List     TAKE these medications    amLODipine 10 MG tablet Commonly known as: NORVASC Take 1 tablet (10 mg total) by mouth daily.   Goodys Extra Strength [id number] MG Pack Generic drug: Aspirin-Acetaminophen-Caffeine Take 1-2 packets by mouth daily as needed (pain).   HYDROcodone-acetaminophen 5-325 MG tablet Commonly known as: Norco Take 1 tablet by mouth every 4 (four) hours as needed for up to 3 days for moderate pain.        Follow-up Information     Carolan Shiver, MD Follow up in 2 week(s).   Specialty: General Surgery Why: Call office on Tuesday to confirm appointment day and time in  2 weeks Contact information: [address] [place] [address] [phone]

## 2020-11-15 NOTE — Anesthesia Postprocedure Evaluation (Signed)
Anesthesia Post Note  Patient: Joseph Waters  Procedure(s) Performed: XI ROBOT ASSISTED UMBILICAL HERNIA REPAIR  Patient location during evaluation: PACU Anesthesia Type: General Level of consciousness: awake and alert Pain management: pain level controlled Vital Signs Assessment: post-procedure vital signs reviewed and stable Respiratory status: spontaneous breathing, nonlabored ventilation, respiratory function stable and patient connected to nasal cannula oxygen Cardiovascular status: blood pressure returned to baseline and stable Postop Assessment: no apparent nausea or vomiting Anesthetic complications: no   No notable events documented.   Last Vitals:  Vitals:   11/03/20 0528 11/03/20 0807  BP: 136/82 (!) 143/75  Pulse: 80 71  Resp: 18 16  Temp: 36.8 C 37 C  SpO2: 95% 94%    Last Pain:  Vitals:   11/03/20 1221  TempSrc:   PainSc: 7                  Yevette Edwards

## 2021-04-09 ENCOUNTER — Other Ambulatory Visit: Payer: Self-pay

## 2021-04-09 ENCOUNTER — Emergency Department: Payer: Self-pay

## 2021-04-09 ENCOUNTER — Emergency Department
Admission: EM | Admit: 2021-04-09 | Discharge: 2021-04-09 | Disposition: A | Discharge status: Home / Self Care | Payer: Self-pay | Attending: Emergency Medicine | Admitting: Emergency Medicine

## 2021-04-09 ENCOUNTER — Encounter: Payer: Self-pay | Admitting: Emergency Medicine

## 2021-04-09 DIAGNOSIS — R519 Headache, unspecified: Secondary | ICD-10-CM | POA: Insufficient documentation

## 2021-04-09 DIAGNOSIS — F17200 Nicotine dependence, unspecified, uncomplicated: Secondary | ICD-10-CM | POA: Insufficient documentation

## 2021-04-09 DIAGNOSIS — I10 Essential (primary) hypertension: Secondary | ICD-10-CM | POA: Insufficient documentation

## 2021-04-09 LAB — COMPREHENSIVE METABOLIC PANEL
ALT: 22 U/L (ref 0–44)
AST: 25 U/L (ref 15–41)
Albumin: 3.9 g/dL (ref 3.5–5.0)
Alkaline Phosphatase: 76 U/L (ref 38–126)
Anion gap: 5 (ref 5–15)
BUN: 19 mg/dL (ref 6–20)
CO2: 29 mmol/L (ref 22–32)
Calcium: 8.7 mg/dL — ABNORMAL LOW (ref 8.9–10.3)
Chloride: 105 mmol/L (ref 98–111)
Creatinine, Ser: 1.09 mg/dL (ref 0.61–1.24)
GFR, Estimated: >60 mL/min (ref >60)
Glucose, Bld: 132 mg/dL — ABNORMAL HIGH (ref 70–99)
Potassium: 4.5 mmol/L (ref 3.5–5.1)
Sodium: 139 mmol/L (ref 135–145)
Total Bilirubin: 0.9 mg/dL (ref 0.3–1.2)
Total Protein: 7.3 g/dL (ref 6.5–8.1)

## 2021-04-09 LAB — CBC
HCT: 51.1 % (ref 39.0–52.0)
Hemoglobin: 16.8 g/dL (ref 13.0–17.0)
MCH: 31.7 pg (ref 26.0–34.0)
MCHC: 32.9 g/dL (ref 30.0–36.0)
MCV: 96.4 fL (ref 80.0–100.0)
Platelets: 203 10*3/uL (ref 150–400)
RBC: 5.3 MIL/uL (ref 4.22–5.81)
RDW: 13 % (ref 11.5–15.5)
WBC: 8.9 10*3/uL (ref 4.0–10.5)
nRBC: 0 % (ref 0.0–0.2)

## 2021-04-09 LAB — TROPONIN I (HIGH SENSITIVITY): Troponin I (High Sensitivity): 6 ng/L (ref <18)

## 2021-04-09 MED ORDER — AMLODIPINE BESYLATE 5 MG PO TABS: 10.0000 mg | ORAL_TABLET | Freq: Every day | ORAL | 11 refills | Status: DC

## 2021-04-09 NOTE — ED Notes (Signed)
See triage note. Pt complains of HTN, has not taken BP meds for years. EDP at bedside. Has had intermittent L sided throbbing HA and also intermittent tingling to L face since last night, also shooting "nerve pain" in legs and hands, also complains of L hand swelling. Denies PMH, states at some point may have been taking HCTZ.

## 2021-04-09 NOTE — ED Notes (Signed)
SQ printer not working, will send labs with chart labels. IT ticket was placed 2 days ago for same printer.

## 2021-04-09 NOTE — ED Notes (Signed)
Pt to CT

## 2021-04-09 NOTE — ED Triage Notes (Signed)
Patient ambulatory to triage with steady gait, without difficulty or distress noted; pt reports since last night having left sided HA and "pressure" to left side of face; st hx of same with HTN; has been off meds for "years"

## 2021-04-09 NOTE — ED Provider Notes (Signed)
[facility] Provider Note    Event Date/Time   First MD Initiated Contact with Patient 04/09/21 [phone]     (approximate)  History   Chief Complaint: Hypertension  HPI  Joseph Waters is a 46 y.o. male with a past medical history of hypertension but off medications for several years presents to the emergency department for multiple complaints.  According to the patient he feels like his blood pressure has been high over the past few weeks or months.  States he has been feeling like his nerves are "firing" with various pains in his extremities which she describes as shooting type pains.  Patient also states he has been intermittently experiencing a headache for the past several weeks including this morning.  He also states yesterday a spider fell onto his face and today he felt headache to the left head and a tingling sensation which concerned him so he came to the emergency department.  Patient states he was on blood pressure medications previously but is been many years since he is taking them.  Patient does admit to daily smoking as well as 2 beers daily.  Physical Exam   Triage Vital Signs: ED Triage Vitals  Enc Vitals Group     BP 04/09/21 0701 (!) 180/95     Pulse Rate 04/09/21 0701 89     Resp 04/09/21 0701 20     Temp 04/09/21 0701 98.5 F (36.9 C)     Temp Source 04/09/21 0701 Oral     SpO2 04/09/21 0701 97 %     Weight 04/09/21 0658 (!) 410 lb (186 kg)     Height 04/09/21 0658 6' (1.829 m)     Head Circumference --      Peak Flow --      Pain Score 04/09/21 0658 9     Pain Loc --      Pain Edu? --      Excl. in GC? --     Most recent vital signs: Vitals:   04/09/21 0701  BP: (!) 180/95  Pulse: 89  Resp: 20  Temp: 98.5 F (36.9 C)  SpO2: 97%    General: Awake, no distress.  CV:  Good peripheral perfusion.  Regular rate and rhythm  Resp:  Normal effort.  Equal breath sounds bilaterally.  Abd:  No distention.  Soft, nontender.  No  rebound or guarding.  Obese   ED Results / Procedures / Treatments    I personally reviewed the CT images, no acute finding on my evaluation. Radiology is read the CT is negative.  EKG  EKG viewed and interpreted by myself shows a normal sinus rhythm at 79 bpm with a narrow QRS, normal axis, normal intervals, no concerning ST changes.  MEDICATIONS ORDERED IN ED: Medications - No data to display   IMPRESSION / MDM / ASSESSMENT AND PLAN / ED COURSE  I reviewed the triage vital signs and the nursing notes.  Patient presents to the emergency department with concerns of high blood pressure intermittent headaches tingling in various pains throughout his body.  States symptoms have been ongoing for months but worse today so he came to the emergency department.  Currently patient appears well, no concerning findings on my examination, moves all extremities well.  States his headache is diminished.  Given the patient's symptoms differential would include electrolyte or metabolic abnormalities such as renal dysfunction due to hypertension, less likely ACS or CVA however we will proceed with CT imaging the head  given his worsening headache, check labs including cardiac enzymes and obtain an EKG.  As long as the patient's medical work-up does not show any significant abnormalities I anticipate likely discharge home with blood pressure medications.  I discussed this with the patient who is agreeable to plan.  Patient's work-up is reassuring.  Chemistry is normal including normal renal function.  Troponin is negative and a reassuring CBC.  Given the patient's normal work-up with reassuring EKG and reassuring physical exam I believe the patient is safe for discharge home we will start the patient on amlodipine for blood pressure control.  Patient is to follow-up with the PCP in the next 2 weeks for recheck/reevaluation.  FINAL CLINICAL IMPRESSION(S) / ED DIAGNOSES   Hypertension  Rx / DC Orders    amlodipine  Note:  This document was prepared using Dragon voice recognition software and may include unintentional dictation errors.   Minna Antis, MD 04/09/21 [phone]

## 2021-04-09 NOTE — ED Notes (Signed)
EKG handed to provider. NSR on 12 lead ekg.

## 2021-07-22 ENCOUNTER — Emergency Department
Admission: EM | Admit: 2021-07-22 | Discharge: 2021-07-22 | Discharge status: Against Medical Advice | Payer: Self-pay | Attending: Emergency Medicine | Admitting: Emergency Medicine

## 2021-07-22 ENCOUNTER — Other Ambulatory Visit: Payer: Self-pay

## 2021-07-22 ENCOUNTER — Emergency Department: Payer: Self-pay

## 2021-07-22 DIAGNOSIS — R0602 Shortness of breath: Secondary | ICD-10-CM | POA: Insufficient documentation

## 2021-07-22 DIAGNOSIS — R2243 Localized swelling, mass and lump, lower limb, bilateral: Secondary | ICD-10-CM | POA: Insufficient documentation

## 2021-07-22 DIAGNOSIS — Z5321 Procedure and treatment not carried out due to patient leaving prior to being seen by health care provider: Secondary | ICD-10-CM | POA: Insufficient documentation

## 2021-07-22 LAB — CBC
HCT: 51.1 % (ref 39.0–52.0)
Hemoglobin: 17 g/dL (ref 13.0–17.0)
MCH: 31.4 pg (ref 26.0–34.0)
MCHC: 33.3 g/dL (ref 30.0–36.0)
MCV: 94.3 fL (ref 80.0–100.0)
Platelets: 243 10*3/uL (ref 150–400)
RBC: 5.42 MIL/uL (ref 4.22–5.81)
RDW: 12.8 % (ref 11.5–15.5)
WBC: 12.7 10*3/uL — ABNORMAL HIGH (ref 4.0–10.5)
nRBC: 0 % (ref 0.0–0.2)

## 2021-07-22 LAB — BASIC METABOLIC PANEL
Anion gap: 8 (ref 5–15)
BUN: 15 mg/dL (ref 6–20)
CO2: 28 mmol/L (ref 22–32)
Calcium: 8.9 mg/dL (ref 8.9–10.3)
Chloride: 100 mmol/L (ref 98–111)
Creatinine, Ser: 0.98 mg/dL (ref 0.61–1.24)
GFR, Estimated: >60 mL/min (ref >60)
Glucose, Bld: 134 mg/dL — ABNORMAL HIGH (ref 70–99)
Potassium: 4.3 mmol/L (ref 3.5–5.1)
Sodium: 136 mmol/L (ref 135–145)

## 2021-07-22 LAB — BRAIN NATRIURETIC PEPTIDE: B Natriuretic Peptide: 37.5 pg/mL (ref 0.0–100.0)

## 2021-07-22 NOTE — ED Notes (Signed)
No answer when called several times from lobby 

## 2021-07-22 NOTE — ED Triage Notes (Addendum)
Pt comes with c/o bilateral leg and ankle swelling for few days. Pt also states swelling is really all over just worse in legs. Pt states SOB. Pt states hx of this but it has gone away in past.   Pt denies any CP.

## 2021-07-22 NOTE — ED Notes (Addendum)
No answer when called several times from lobby; no answer when phone # listed in chart called 

## 2021-07-24 ENCOUNTER — Other Ambulatory Visit: Payer: Self-pay

## 2021-07-24 ENCOUNTER — Emergency Department: Payer: Self-pay

## 2021-07-24 ENCOUNTER — Emergency Department
Admission: EM | Admit: 2021-07-24 | Discharge: 2021-07-24 | Disposition: A | Discharge status: Home / Self Care | Payer: Self-pay | Attending: Emergency Medicine | Admitting: Emergency Medicine

## 2021-07-24 DIAGNOSIS — I10 Essential (primary) hypertension: Secondary | ICD-10-CM | POA: Insufficient documentation

## 2021-07-24 DIAGNOSIS — R609 Edema, unspecified: Secondary | ICD-10-CM

## 2021-07-24 DIAGNOSIS — R0602 Shortness of breath: Secondary | ICD-10-CM | POA: Insufficient documentation

## 2021-07-24 DIAGNOSIS — I872 Venous insufficiency (chronic) (peripheral): Secondary | ICD-10-CM | POA: Insufficient documentation

## 2021-07-24 LAB — COMPREHENSIVE METABOLIC PANEL
ALT: 22 U/L (ref 0–44)
AST: 22 U/L (ref 15–41)
Albumin: 4 g/dL (ref 3.5–5.0)
Alkaline Phosphatase: 82 U/L (ref 38–126)
Anion gap: 9 (ref 5–15)
BUN: 15 mg/dL (ref 6–20)
CO2: 28 mmol/L (ref 22–32)
Calcium: 9.1 mg/dL (ref 8.9–10.3)
Chloride: 100 mmol/L (ref 98–111)
Creatinine, Ser: 0.93 mg/dL (ref 0.61–1.24)
GFR, Estimated: >60 mL/min (ref >60)
Glucose, Bld: 132 mg/dL — ABNORMAL HIGH (ref 70–99)
Potassium: 4.2 mmol/L (ref 3.5–5.1)
Sodium: 137 mmol/L (ref 135–145)
Total Bilirubin: 1 mg/dL (ref 0.3–1.2)
Total Protein: 7.3 g/dL (ref 6.5–8.1)

## 2021-07-24 LAB — URINALYSIS, ROUTINE W REFLEX MICROSCOPIC
Bilirubin Urine: NEGATIVE
Glucose, UA: NEGATIVE mg/dL
Hgb urine dipstick: NEGATIVE
Ketones, ur: NEGATIVE mg/dL
Leukocytes,Ua: NEGATIVE
Nitrite: NEGATIVE
Protein, ur: NEGATIVE mg/dL
Specific Gravity, Urine: 1.014 (ref 1.005–1.030)
pH: 7 (ref 5.0–8.0)

## 2021-07-24 LAB — CBC
HCT: 50.9 % (ref 39.0–52.0)
Hemoglobin: 16.8 g/dL (ref 13.0–17.0)
MCH: 31.3 pg (ref 26.0–34.0)
MCHC: 33 g/dL (ref 30.0–36.0)
MCV: 94.8 fL (ref 80.0–100.0)
Platelets: 207 10*3/uL (ref 150–400)
RBC: 5.37 MIL/uL (ref 4.22–5.81)
RDW: 12.9 % (ref 11.5–15.5)
WBC: 9.4 10*3/uL (ref 4.0–10.5)
nRBC: 0 % (ref 0.0–0.2)

## 2021-07-24 LAB — D-DIMER, QUANTITATIVE: D-Dimer, Quant: 0.47 ug/mL-FEU (ref 0.00–0.50)

## 2021-07-24 LAB — BRAIN NATRIURETIC PEPTIDE: B Natriuretic Peptide: 19.9 pg/mL (ref 0.0–100.0)

## 2021-07-24 MED ORDER — FUROSEMIDE 20 MG PO TABS: 20.0000 mg | ORAL_TABLET | Freq: Every day | ORAL | 0 refills | Status: DC

## 2021-07-24 NOTE — ED Triage Notes (Signed)
Pt comes with c/I bilateral leg swelling and SOB for few days. Pt was seen here 2 days ago but had to leave due to wait. Pt states symptoms are worse and not any better.

## 2021-07-24 NOTE — ED Notes (Signed)
Pt having bilateral leg pain and swelling for the past few weeks and shortness as breath intermittently but worse over the last few days.

## 2021-07-24 NOTE — ED Provider Notes (Signed)
[facility] Provider Note    Event Date/Time   First MD Initiated Contact with Patient 07/24/21 1143     (approximate)   History   Chief Complaint Shortness of Breath and Leg Pain   HPI  Joseph Waters is a 46 y.o. male with past medical history of hypertension who presents to the ED complaining of shortness of breath and leg swelling.  Patient reports that over the past 2 weeks he has been having increasing swelling in both of his legs, particularly in his calf areas.  He reports a small amount of redness in these areas and he is beginning to have significant pain in his calves.  The swelling and discomfort limit his ability to walk and he has started getting out of breath easily.  He denies any fevers, cough, or chest pain.  He denies any history of DVT/PE, has not had any recent long travel or surgery.  He denies any history of heart problems and states he has been compliant with his blood pressure medication.     Physical Exam   Triage Vital Signs: ED Triage Vitals  Enc Vitals Group     BP 07/24/21 1038 (!) 152/110     Pulse Rate 07/24/21 1038 88     Resp 07/24/21 1038 19     Temp 07/24/21 1038 98 F (36.7 C)     Temp src --      SpO2 07/24/21 1038 95 %     Weight --      Height --      Head Circumference --      Peak Flow --      Pain Score 07/24/21 1036 0     Pain Loc --      Pain Edu? --      Excl. in GC? --     Most recent vital signs: Vitals:   07/24/21 1038 07/24/21 1400  BP: (!) 152/110 (!) 151/82  Pulse: 88 66  Resp: 19 16  Temp: 98 F (36.7 C)   SpO2: 95% 96%    Constitutional: Alert and oriented. Eyes: Conjunctivae are normal. Head: Atraumatic. Nose: No congestion/rhinnorhea. Mouth/Throat: Mucous membranes are moist.  Cardiovascular: Normal rate, regular rhythm. Grossly normal heart sounds.  2+ radial and DP pulses bilaterally. Respiratory: Normal respiratory effort.  No retractions. Lungs CTAB. Gastrointestinal: Soft  and nontender. No distention. Musculoskeletal: 2+ pitting edema to just above knees bilaterally with no associated erythema or warmth, mild calf tenderness noted.  Varicose veins noted. Neurologic:  Normal speech and language. No gross focal neurologic deficits are appreciated.    ED Results / Procedures / Treatments   Labs (all labs ordered are listed, but only abnormal results are displayed) Labs Reviewed  COMPREHENSIVE METABOLIC PANEL - Abnormal; Notable for the following components:      Result Value   Glucose, Bld 132 (*)    All other components within normal limits  URINALYSIS, ROUTINE W REFLEX MICROSCOPIC - Abnormal; Notable for the following components:   Color, Urine YELLOW (*)    APPearance CLEAR (*)    All other components within normal limits  CBC  BRAIN NATRIURETIC PEPTIDE  D-DIMER, QUANTITATIVE     EKG  ED ECG REPORT I, Chesley Noon, the attending physician, personally viewed and interpreted this ECG.   Date: 07/24/2021  EKG Time: 10:34  Rate: 82  Rhythm: normal sinus rhythm  Axis: Normal  Intervals:none  ST&T Change: None  RADIOLOGY Chest x-ray reviewed and interpreted by  me with no focal infiltrate, edema, or effusion.  PROCEDURES:  Critical Care performed: No  Procedures   MEDICATIONS ORDERED IN ED: Medications - No data to display   IMPRESSION / MDM / ASSESSMENT AND PLAN / ED COURSE  I reviewed the triage vital signs and the nursing notes.                              46 y.o. male with past medical history of hypertension who presents to the ED with increasing pain and swelling in his bilateral lower extremities for the past 2 weeks, now with some mild difficulty breathing.  Differential diagnosis includes, but is not limited to, CHF, PE, pneumonia, DVT, venous insufficiency, lymphedema, peripheral edema.  Patient well-appearing and in no acute distress, vitals remarkable for elevated blood pressure but otherwise reassuring.  EKG shows  no evidence of arrhythmia or ischemia and he is not in any respiratory distress, maintaining oxygen saturations on room air.  Chest x-ray is unremarkable with no signs of CHF and I doubt CHF given BNP within normal limits.  Additional labs are reassuring with CBC showing no anemia or leukocytosis, BMP without electrolyte abnormality or AKI, LFTs within normal limits.  We will check D-dimer to further assess for DVT and PE given patient is low risk by [place].  D-dimer within normal limits and I doubt DVT or PE at this time.  Symptoms seem consistent with peripheral edema and venous insufficiency, patient is appropriate for discharge home with short course of diuretic.  He was also counseled on elevation of his legs as well as use of compression stockings.  He was counseled to establish care with PCP and to return to the ED for new worsening symptoms.  Patient agrees with plan.      FINAL CLINICAL IMPRESSION(S) / ED DIAGNOSES   Final diagnoses:  Peripheral edema  Venous insufficiency     Rx / DC Orders   ED Discharge Orders          Ordered    furosemide (LASIX) 20 MG tablet  Daily        07/24/21 1433             Note:  This document was prepared using Dragon voice recognition software and may include unintentional dictation errors.   Chesley Noon, MD 07/24/21 1435

## 2021-08-27 ENCOUNTER — Telehealth: Payer: Self-pay

## 2021-11-11 ENCOUNTER — Emergency Department
Admission: EM | Admit: 2021-11-11 | Discharge: 2021-11-11 | Disposition: A | Discharge status: Home / Self Care | Payer: Self-pay | Attending: Emergency Medicine | Admitting: Emergency Medicine

## 2021-11-11 ENCOUNTER — Emergency Department: Payer: Self-pay

## 2021-11-11 ENCOUNTER — Other Ambulatory Visit: Payer: Self-pay

## 2021-11-11 ENCOUNTER — Encounter: Payer: Self-pay | Admitting: Emergency Medicine

## 2021-11-11 DIAGNOSIS — D72829 Elevated white blood cell count, unspecified: Secondary | ICD-10-CM | POA: Insufficient documentation

## 2021-11-11 DIAGNOSIS — L0291 Cutaneous abscess, unspecified: Secondary | ICD-10-CM

## 2021-11-11 DIAGNOSIS — N454 Abscess of epididymis or testis: Secondary | ICD-10-CM | POA: Insufficient documentation

## 2021-11-11 LAB — CBC WITH DIFFERENTIAL/PLATELET
Abs Immature Granulocytes: 0.08 10*3/uL — ABNORMAL HIGH (ref 0.00–0.07)
Basophils Absolute: 0.1 10*3/uL (ref 0.0–0.1)
Basophils Relative: 0 %
Eosinophils Absolute: 0.1 10*3/uL (ref 0.0–0.5)
Eosinophils Relative: 1 %
HCT: 52.5 % — ABNORMAL HIGH (ref 39.0–52.0)
Hemoglobin: 17.3 g/dL — ABNORMAL HIGH (ref 13.0–17.0)
Immature Granulocytes: 1 %
Lymphocytes Relative: 18 %
Lymphs Abs: 2.2 10*3/uL (ref 0.7–4.0)
MCH: 30.4 pg (ref 26.0–34.0)
MCHC: 33 g/dL (ref 30.0–36.0)
MCV: 92.1 fL (ref 80.0–100.0)
Monocytes Absolute: 0.6 10*3/uL (ref 0.1–1.0)
Monocytes Relative: 5 %
Neutro Abs: 8.8 10*3/uL — ABNORMAL HIGH (ref 1.7–7.7)
Neutrophils Relative %: 75 %
Platelets: 239 10*3/uL (ref 150–400)
RBC: 5.7 MIL/uL (ref 4.22–5.81)
RDW: 12.6 % (ref 11.5–15.5)
WBC: 11.8 10*3/uL — ABNORMAL HIGH (ref 4.0–10.5)
nRBC: 0 % (ref 0.0–0.2)

## 2021-11-11 LAB — PROCALCITONIN: Procalcitonin: <0.1 ng/mL

## 2021-11-11 LAB — COMPREHENSIVE METABOLIC PANEL
ALT: 24 U/L (ref 0–44)
AST: 24 U/L (ref 15–41)
Albumin: 4.4 g/dL (ref 3.5–5.0)
Alkaline Phosphatase: 78 U/L (ref 38–126)
Anion gap: 11 (ref 5–15)
BUN: 15 mg/dL (ref 6–20)
CO2: 27 mmol/L (ref 22–32)
Calcium: 9.5 mg/dL (ref 8.9–10.3)
Chloride: 99 mmol/L (ref 98–111)
Creatinine, Ser: 1.03 mg/dL (ref 0.61–1.24)
GFR, Estimated: >60 mL/min (ref >60)
Glucose, Bld: 116 mg/dL — ABNORMAL HIGH (ref 70–99)
Potassium: 4.6 mmol/L (ref 3.5–5.1)
Sodium: 137 mmol/L (ref 135–145)
Total Bilirubin: 1 mg/dL (ref 0.3–1.2)
Total Protein: 7.4 g/dL (ref 6.5–8.1)

## 2021-11-11 LAB — URINALYSIS, ROUTINE W REFLEX MICROSCOPIC
Bilirubin Urine: NEGATIVE
Glucose, UA: NEGATIVE mg/dL
Hgb urine dipstick: NEGATIVE
Ketones, ur: NEGATIVE mg/dL
Leukocytes,Ua: NEGATIVE
Nitrite: NEGATIVE
Protein, ur: NEGATIVE mg/dL
Specific Gravity, Urine: 1.009 (ref 1.005–1.030)
pH: 6 (ref 5.0–8.0)

## 2021-11-11 LAB — LACTIC ACID, PLASMA: Lactic Acid, Venous: 1.5 mmol/L (ref 0.5–1.9)

## 2021-11-11 MED ORDER — CEFTRIAXONE SODIUM 1 G IJ SOLR
1.0000 g | Freq: Once | INTRAMUSCULAR | Status: AC
  Administered 2021-11-11: 1 g via INTRAMUSCULAR
  Filled 2021-11-11: qty 10

## 2021-11-11 MED ORDER — OXYCODONE-ACETAMINOPHEN 5-325 MG PO TABS: 1.0000 | ORAL_TABLET | Freq: Four times a day (QID) | ORAL | 0 refills | Status: AC | PRN

## 2021-11-11 MED ORDER — DOXYCYCLINE MONOHYDRATE 100 MG PO TABS: 100.0000 mg | ORAL_TABLET | Freq: Two times a day (BID) | ORAL | 0 refills | Status: AC

## 2021-11-11 MED ORDER — OXYCODONE-ACETAMINOPHEN 5-325 MG PO TABS
1.0000 | ORAL_TABLET | Freq: Once | ORAL | Status: AC
  Administered 2021-11-11: 1 via ORAL
  Filled 2021-11-11: qty 1

## 2021-11-11 MED ORDER — AMLODIPINE BESYLATE 5 MG PO TABS: 5.0000 mg | ORAL_TABLET | Freq: Two times a day (BID) | ORAL | 6 refills | Status: DC

## 2021-11-11 MED ORDER — CEPHALEXIN 500 MG PO CAPS: 500.0000 mg | ORAL_CAPSULE | Freq: Four times a day (QID) | ORAL | 0 refills | Status: AC

## 2021-11-11 MED ORDER — LIDOCAINE HCL 1 % IJ SOLN
10.0000 mL | Freq: Once | INTRAMUSCULAR | Status: AC
  Administered 2021-11-11: 10 mL
  Filled 2021-11-11: qty 10

## 2021-11-11 MED ORDER — ONDANSETRON 4 MG PO TBDP: 4.0000 mg | ORAL_TABLET | Freq: Three times a day (TID) | ORAL | 0 refills | Status: AC | PRN

## 2021-11-11 MED ORDER — ONDANSETRON 4 MG PO TBDP
4.0000 mg | ORAL_TABLET | Freq: Once | ORAL | Status: AC
  Administered 2021-11-11: 4 mg via ORAL
  Filled 2021-11-11: qty 1

## 2021-11-11 NOTE — ED Provider Notes (Signed)
[facility] Provider Note  Patient Contact: 4:21 PM (approximate)   History   Abscess   HPI  Joseph Waters is a 46 y.o. male with a history of cutaneous abscesses, presents to the emergency department with right-sided scrotal pain, erythema and body aches.  Patient denies having history of testicular torsion or abscess in the past.  He denies dysuria, hematuria or increased urinary frequency.  No rhinorrhea, nasal congestion or nonproductive cough.  He denies fever and chills.      Physical Exam   Triage Vital Signs: ED Triage Vitals  Enc Vitals Group     BP 11/11/21 1513 (!) 181/105     Pulse Rate 11/11/21 1513 87     Resp 11/11/21 1513 18     Temp 11/11/21 1513 98.1 F (36.7 C)     Temp Source 11/11/21 1513 Oral     SpO2 11/11/21 1513 97 %     Weight 11/11/21 1511 (!) 400 lb (181.4 kg)     Height 11/11/21 1511 6' (1.829 m)     Head Circumference --      Peak Flow --      Pain Score 11/11/21 1511 8     Pain Loc --      Pain Edu? --      Excl. in GC? --     Most recent vital signs: Vitals:   11/11/21 1513 11/11/21 1838  BP: (!) 181/105 (!) 186/103  Pulse: 87 83  Resp: 18 18  Temp: 98.1 F (36.7 C)   SpO2: 97% 98%     General: Alert and in no acute distress. Eyes:  PERRL. EOMI. Head: No acute traumatic findings ENT:      Nose: No congestion/rhinnorhea.      Mouth/Throat: Mucous membranes are moist. Neck: No stridor. No cervical spine tenderness to palpation. Cardiovascular:  Good peripheral perfusion Respiratory: Normal respiratory effort without tachypnea or retractions. Lungs CTAB. Good air entry to the bases with no decreased or absent breath sounds. Gastrointestinal: Bowel sounds 4 quadrants. Soft and nontender to palpation. No guarding or rigidity. No palpable masses. No distention. No CVA tenderness. Musculoskeletal: Full range of motion to all extremities.  Neurologic:  No gross focal neurologic deficits are appreciated.   Skin: Patient has right-sided scrotal erythema and a 1 cm x 1 cm region of induration.  No appreciable fluctuance. Other:   ED Results / Procedures / Treatments   Labs (all labs ordered are listed, but only abnormal results are displayed) Labs Reviewed  CBC WITH DIFFERENTIAL/PLATELET - Abnormal; Notable for the following components:      Result Value   WBC 11.8 (*)    Hemoglobin 17.3 (*)    HCT 52.5 (*)    Neutro Abs 8.8 (*)    Abs Immature Granulocytes 0.08 (*)    All other components within normal limits  COMPREHENSIVE METABOLIC PANEL - Abnormal; Notable for the following components:   Glucose, Bld 116 (*)    All other components within normal limits  URINALYSIS, ROUTINE W REFLEX MICROSCOPIC - Abnormal; Notable for the following components:   Color, Urine YELLOW (*)    APPearance CLEAR (*)    All other components within normal limits  LACTIC ACID, PLASMA  PROCALCITONIN  LACTIC ACID, PLASMA        RADIOLOGY  I personally viewed and evaluated these images as part of my medical decision making, as well as reviewing the written report by the radiologist.  ED Provider Interpretation: Scrotal  ultrasound: 9 mm developing abscess along right scrotal wall.   PROCEDURES:  Critical Care performed: No  Procedures   MEDICATIONS ORDERED IN ED: Medications  lidocaine (XYLOCAINE) 1 % (with pres) injection 10 mL (10 mLs Infiltration Given 11/11/21 1811)  cefTRIAXone (ROCEPHIN) injection 1 g (1 g Intramuscular Given 11/11/21 1812)  oxyCODONE-acetaminophen (PERCOCET/ROXICET) 5-325 MG per tablet 1 tablet (1 tablet Oral Given 11/11/21 1818)  ondansetron (ZOFRAN-ODT) disintegrating tablet 4 mg (4 mg Oral Given 11/11/21 1819)     IMPRESSION / MDM / ASSESSMENT AND PLAN / ED COURSE  I reviewed the triage vital signs and the nursing notes.                              Assessment and plan: Scrotal pain: 46 year old male presents to the emergency department with right-sided scrotal  pain and erythema.  Patient was hypertensive at triage but vital signs were otherwise reassuring.  Patient reported that he has been out of his amlodipine for the past several weeks and would like a refill of medication during this emergency department encounter.  Patient had right-sided scrotal edema and induration on exam.  Patient underwent incision and drainage without complication as scrotal ultrasound indicated an 9 mm possibly developing abscess without evidence of torsion or other deep space abscess.  Patient had mildly elevated white blood cell count 11.8.  Procalcitonin and lactic within range.  CMP reassuring.  Urinalysis shows no signs of UTI.  Patient was advised to remove his packing in 3 days.  He was given an injection of Rocephin prior to discharge and started on doxycycline and Keflex.  He was given strict return precautions to return to the emergency department with worsening pain, fever or other new symptoms.     FINAL CLINICAL IMPRESSION(S) / ED DIAGNOSES   Final diagnoses:  Abscess     Rx / DC Orders   ED Discharge Orders          Ordered    doxycycline (ADOXA) 100 MG tablet  2 times daily        11/11/21 1804    cephALEXin (KEFLEX) 500 MG capsule  4 times daily        11/11/21 1804    oxyCODONE-acetaminophen (PERCOCET/ROXICET) 5-325 MG tablet  Every 6 hours PRN        11/11/21 1807    ondansetron (ZOFRAN-ODT) 4 MG disintegrating tablet  Every 8 hours PRN        11/11/21 1807    amLODipine (NORVASC) 5 MG tablet  2 times daily        11/11/21 1835             Note:  This document was prepared using Dragon voice recognition software and may include unintentional dictation errors.   Pia Mau Segundo, PA-C 11/11/21 1917    Concha Se, MD 11/11/21 2126

## 2021-11-11 NOTE — ED Notes (Signed)
See triage note  Presents with possible area to right testicle    States he noticed the area couple of days ago  Now area is larger.

## 2021-11-11 NOTE — Discharge Instructions (Addendum)
Take doxycycline twice daily for the next 7 days. Take 2 tablets of Keflex in the morning and 2 tablets at night for the next 7 days. Remove packing in 3 days. Keep wound clean and dry for the next 24 hours.

## 2021-11-11 NOTE — ED Triage Notes (Signed)
Pt via POV from home. Pt c/o abscess on his R testicle that he noticed last Monday and states it was pea-sized and now it is the size of a ping-pong. Denies any urinary symptoms. Pt also states he has been out of his BP medication

## 2022-07-24 ENCOUNTER — Encounter: Payer: Self-pay | Admitting: *Deleted

## 2022-08-18 ENCOUNTER — Telehealth: Payer: Self-pay

## 2022-08-18 ENCOUNTER — Other Ambulatory Visit: Payer: Self-pay

## 2022-08-18 DIAGNOSIS — Z1211 Encounter for screening for malignant neoplasm of colon: Secondary | ICD-10-CM

## 2022-08-18 DIAGNOSIS — K430 Incisional hernia with obstruction, without gangrene: Secondary | ICD-10-CM

## 2022-08-18 MED ORDER — NA SULFATE-K SULFATE-MG SULF 17.5-3.13-1.6 GM/177ML PO SOLN: 1.0000 | Freq: Once | ORAL | 0 refills | Status: AC

## 2022-08-18 NOTE — Telephone Encounter (Signed)
PT left vm to schedule colonoscopy please return call

## 2022-08-18 NOTE — Telephone Encounter (Signed)
Gastroenterology Pre-Procedure Review  Request Date: 10/07/22 Requesting Physician: Dr. Tobi Bastos  PATIENT REVIEW QUESTIONS: The patient responded to the following health history questions as indicated:    1. Are you having any GI issues? no 2. Do you have a personal history of Polyps? no 3. Do you have a family history of Colon Cancer or Polyps? no 4. Diabetes Mellitus? no 5. Joint replacements in the past 12 months?no however hernia surgery a few years ago 6. Major health problems in the past 3 months?no 7. Any artificial heart valves, MVP, or defibrillator?no    MEDICATIONS & ALLERGIES:    Patient reports the following regarding taking any anticoagulation/antiplatelet therapy:   Plavix, Coumadin, Eliquis, Xarelto, Lovenox, Pradaxa, Brilinta, or Effient? no Aspirin? no  Patient confirms/reports the following medications:  Current Outpatient Medications  Medication Sig Dispense Refill   amLODipine (NORVASC) 5 MG tablet Take 1 tablet (5 mg total) by mouth in the morning and at bedtime. 60 tablet 6   Aspirin-Acetaminophen-Caffeine (GOODYS EXTRA STRENGTH) 500-325-65 MG PACK Take 1-2 packets by mouth daily as needed (pain).     furosemide (LASIX) 20 MG tablet Take 1 tablet (20 mg total) by mouth daily for 7 days. 7 tablet 0   No current facility-administered medications for this visit.    Patient confirms/reports the following allergies:  No Known Allergies  No orders of the defined types were placed in this encounter.   AUTHORIZATION INFORMATION Primary Insurance: 1D#: Group #:  Secondary Insurance: 1D#: Group #:  SCHEDULE INFORMATION: Date: 10/07/22 Time: Location: [facility]

## 2022-08-19 ENCOUNTER — Telehealth: Payer: Self-pay

## 2022-08-19 NOTE — Telephone Encounter (Signed)
Pt left message to schedule colonoscopy please return call  

## 2022-08-29 ENCOUNTER — Emergency Department
Admission: EM | Admit: 2022-08-29 | Discharge: 2022-08-29 | Disposition: A | Discharge status: Home / Self Care | Payer: Medicaid Other | Attending: Emergency Medicine | Admitting: Emergency Medicine

## 2022-08-29 ENCOUNTER — Encounter: Payer: Self-pay | Admitting: *Deleted

## 2022-08-29 ENCOUNTER — Emergency Department: Payer: Medicaid Other

## 2022-08-29 ENCOUNTER — Other Ambulatory Visit: Payer: Self-pay

## 2022-08-29 DIAGNOSIS — K0889 Other specified disorders of teeth and supporting structures: Secondary | ICD-10-CM | POA: Insufficient documentation

## 2022-08-29 DIAGNOSIS — R079 Chest pain, unspecified: Secondary | ICD-10-CM | POA: Diagnosis present

## 2022-08-29 LAB — CBC WITH DIFFERENTIAL/PLATELET
Abs Immature Granulocytes: 0.07 10*3/uL (ref 0.00–0.07)
Basophils Absolute: 0.1 10*3/uL (ref 0.0–0.1)
Basophils Relative: 1 %
Eosinophils Absolute: 0.2 10*3/uL (ref 0.0–0.5)
Eosinophils Relative: 1 %
HCT: 50.6 % (ref 39.0–52.0)
Hemoglobin: 16.8 g/dL (ref 13.0–17.0)
Immature Granulocytes: 1 %
Lymphocytes Relative: 20 %
Lymphs Abs: 2.1 10*3/uL (ref 0.7–4.0)
MCH: 30.8 pg (ref 26.0–34.0)
MCHC: 33.2 g/dL (ref 30.0–36.0)
MCV: 92.7 fL (ref 80.0–100.0)
Monocytes Absolute: 0.5 10*3/uL (ref 0.1–1.0)
Monocytes Relative: 5 %
Neutro Abs: 7.4 10*3/uL (ref 1.7–7.7)
Neutrophils Relative %: 72 %
Platelets: 204 10*3/uL (ref 150–400)
RBC: 5.46 MIL/uL (ref 4.22–5.81)
RDW: 12.7 % (ref 11.5–15.5)
WBC: 10.4 10*3/uL (ref 4.0–10.5)
nRBC: 0 % (ref 0.0–0.2)

## 2022-08-29 LAB — COMPREHENSIVE METABOLIC PANEL
ALT: 22 U/L (ref 0–44)
AST: 19 U/L (ref 15–41)
Albumin: 3.9 g/dL (ref 3.5–5.0)
Alkaline Phosphatase: 79 U/L (ref 38–126)
Anion gap: 9 (ref 5–15)
BUN: 17 mg/dL (ref 6–20)
CO2: 25 mmol/L (ref 22–32)
Calcium: 9.2 mg/dL (ref 8.9–10.3)
Chloride: 105 mmol/L (ref 98–111)
Creatinine, Ser: 0.9 mg/dL (ref 0.61–1.24)
GFR, Estimated: >60 mL/min (ref >60)
Glucose, Bld: 120 mg/dL — ABNORMAL HIGH (ref 70–99)
Potassium: 4.1 mmol/L (ref 3.5–5.1)
Sodium: 139 mmol/L (ref 135–145)
Total Bilirubin: 0.7 mg/dL (ref 0.3–1.2)
Total Protein: 7.3 g/dL (ref 6.5–8.1)

## 2022-08-29 LAB — TROPONIN I (HIGH SENSITIVITY)
Troponin I (High Sensitivity): 5 ng/L (ref <18)
Troponin I (High Sensitivity): 5 ng/L (ref <18)

## 2022-08-29 LAB — LIPASE, BLOOD: Lipase: 35 U/L (ref 11–51)

## 2022-08-29 MED ORDER — AMOXICILLIN-POT CLAVULANATE 875-125 MG PO TABS
1.0000 | ORAL_TABLET | Freq: Once | ORAL | Status: AC
  Administered 2022-08-29: 1 via ORAL
  Filled 2022-08-29: qty 1

## 2022-08-29 MED ORDER — AMOXICILLIN-POT CLAVULANATE 875-125 MG PO TABS: 1.0000 | ORAL_TABLET | Freq: Two times a day (BID) | ORAL | 0 refills | Status: AC

## 2022-08-29 NOTE — ED Triage Notes (Signed)
BIB family from home for L sided CP, onset ~ 1 week ago, comes and goes, pinpoints to L breast area, also some transient L lateral flank area which resolved. Mentions R mandibular tooth issues. Sx associated with lightheadedness, diaphoresis, and fatigue. Alert, NAD, calm, interactive.

## 2022-08-29 NOTE — ED Notes (Signed)
Blood sent, ambulatory to xray

## 2022-08-29 NOTE — ED Provider Notes (Signed)
[facility] Provider Note    Event Date/Time   First MD Initiated Contact with Patient 08/29/22 1232     (approximate)   History   Chest Pain   HPI  Joseph Waters is a 47 y.o. male   Past medical history of hypertension presents to the emergency department with multiple complaints.   For the past 3 days he has had intermittent left breast area pain.  No obvious exacerbating or alleviating symptoms, it is not exertional and nonradiating.  No injuries to the area.  No associated shortness of breath cough or fever.  Pain became more constant today.  None currently.  Had transient left flank pain 2 days ago for a few minutes but spontaneously resolved.  No urinary symptoms and currently no pain and has been pain-free for over 24 hours.  No injuries noted.  He reports right-sided lower dental abscess 2 days ago, he poked at it and it self drained, no significant pain now.  He reports that the pain is minimal now, no fever, no further drainage, no further swelling.   External Medical Documents Reviewed: [facility] health system clinical summary which reports past medical history of high blood pressure      Physical Exam   Triage Vital Signs: ED Triage Vitals  Enc Vitals Group     BP 08/29/22 1213 (!) 163/100     Pulse Rate 08/29/22 1213 64     Resp 08/29/22 1213 20     Temp 08/29/22 1213 98.1 F (36.7 C)     Temp Source 08/29/22 1213 Oral     SpO2 08/29/22 1213 95 %     Weight 08/29/22 1210 (!) 430 lb (195 kg)     Height 08/29/22 1210 6' (1.829 m)     Head Circumference --      Peak Flow --      Pain Score 08/29/22 1209 5     Pain Loc --      Pain Edu? --      Excl. in GC? --     Most recent vital signs: Vitals:   08/29/22 1213  BP: (!) 163/100  Pulse: 64  Resp: 20  Temp: 98.1 F (36.7 C)  SpO2: 95%    General: Awake, no distress.  CV:  Good peripheral perfusion.  Resp:  Normal effort.  Abd:  No distention.  Other:  Awake  alert comfortable appearing hypertensive no other vital sign abnormalities.  Breathing comfortably with clear lungs to auscultation bilaterally.  Soft nontender abdomen.  No obvious trauma to the chest wall in the area of his pain, no overlying infectious changes.  No signs of dental abscess   ED Results / Procedures / Treatments   Labs (all labs ordered are listed, but only abnormal results are displayed) Labs Reviewed  COMPREHENSIVE METABOLIC PANEL - Abnormal; Notable for the following components:      Result Value   Glucose, Bld 120 (*)    All other components within normal limits  CBC WITH DIFFERENTIAL/PLATELET  LIPASE, BLOOD  TROPONIN I (HIGH SENSITIVITY)  TROPONIN I (HIGH SENSITIVITY)     I ordered and reviewed the above labs they are notable for initial troponin is negative.    EKG  ED ECG REPORT I, Pilar Jarvis, the attending physician, personally viewed and interpreted this ECG.   Date: 08/29/2022  EKG Time: 1220  Rate: 69  Rhythm: nsr  Axis: lad  Intervals:nl  ST&T Change: no stemi    RADIOLOGY  I independently reviewed and interpreted chest x-ray and I see no obvious focalities or pneumothorax   PROCEDURES:  Critical Care performed: No  Procedures   MEDICATIONS ORDERED IN ED: Medications  amoxicillin-clavulanate (AUGMENTIN) 875-125 MG per tablet 1 tablet (1 tablet Oral Given 08/29/22 1340)    IMPRESSION / MDM / ASSESSMENT AND PLAN / ED COURSE  I reviewed the triage vital signs and the nursing notes.                                Patient's presentation is most consistent with acute presentation with potential threat to life or bodily function.  Differential diagnosis includes, but is not limited to, for chest pain, consider ACS, PE, dissection, musculoskeletal pain, costochondritis, pneumothorax, respiratory infection.  For transient flank pain consider urinary tract infection, pyelonephritis, renal colic.  For dental drainage, consider dental  infection or abscess.    MDM:    Chest pain very nonspecific, none currently.  Given risk factors for cardiac ischemia given his BMI, and hypertension, check for cardiac schema with EKG which fortunately looks nonischemic and check serial troponins.    No evidence of respiratory infection signs or symptoms with clear chest x-ray and negative white blood cell count.  Not clinically concerning for dissection or PE, symptomatology not consistent with these diagnoses.  Flank pain was transient lasting minutes none currently none for the last 24 hours with no associated urinary symptoms, defer further workup given very low concern for renal or intra-abdominal emergencies at this time.  He had a dental abscess which she drained and is mostly improved at this time.  Will provide short course of Augmentin for dental infection and have him follow-up with dentist.  Workup largely unremarkable patient stable and asymptomatic at this time.  Serial troponin is pending, if this is negative plan will be for discharge with close PMD follow-up and return precautions, cardiology referral.       FINAL CLINICAL IMPRESSION(S) / ED DIAGNOSES   Final diagnoses:  Nonspecific chest pain  Pain, dental     Rx / DC Orders   ED Discharge Orders          Ordered    amoxicillin-clavulanate (AUGMENTIN) 875-125 MG tablet  2 times daily        08/29/22 1326    Ambulatory referral to Cardiology       Comments: If you have not heard from the Cardiology office within the next 72 hours please call [phone].   08/29/22 1428             Note:  This document was prepared using Dragon voice recognition software and may include unintentional dictation errors.    Pilar Jarvis, MD 08/29/22 [phone]

## 2022-08-29 NOTE — Discharge Instructions (Addendum)
Take Augmentin antibiotic as prescribed.  Your evaluation in the emergency department for chest pain showed no emergencies like heart attack.  I made a referral to cardiology who will reach out to you to schedule a follow-up appointment for further testing as needed.  Follow-up with your primary doctor this week for checkup.  If you experience any new, worsening, or unexpected symptoms call your doctor right away or come back to the emergency department for reevaluation.

## 2022-10-06 ENCOUNTER — Encounter: Payer: Self-pay | Admitting: Gastroenterology

## 2022-10-07 ENCOUNTER — Ambulatory Visit: Payer: Medicaid Other | Admitting: Certified Registered"

## 2022-10-07 ENCOUNTER — Other Ambulatory Visit: Payer: Self-pay

## 2022-10-07 ENCOUNTER — Encounter
Admission: RE | Disposition: A | Discharge status: Home / Self Care | Payer: Self-pay | Source: Home / Self Care | Attending: Gastroenterology

## 2022-10-07 ENCOUNTER — Ambulatory Visit
Admission: RE | Admit: 2022-10-07 | Discharge: 2022-10-07 | Disposition: A | Discharge status: Home / Self Care | Payer: Medicaid Other | Attending: Gastroenterology | Admitting: Gastroenterology

## 2022-10-07 ENCOUNTER — Other Ambulatory Visit: Payer: Self-pay | Admitting: Gastroenterology

## 2022-10-07 ENCOUNTER — Encounter: Payer: Self-pay | Admitting: Gastroenterology

## 2022-10-07 DIAGNOSIS — D122 Benign neoplasm of ascending colon: Secondary | ICD-10-CM | POA: Insufficient documentation

## 2022-10-07 DIAGNOSIS — D128 Benign neoplasm of rectum: Secondary | ICD-10-CM | POA: Diagnosis not present

## 2022-10-07 DIAGNOSIS — D126 Benign neoplasm of colon, unspecified: Secondary | ICD-10-CM

## 2022-10-07 DIAGNOSIS — I10 Essential (primary) hypertension: Secondary | ICD-10-CM | POA: Insufficient documentation

## 2022-10-07 DIAGNOSIS — Z6841 Body Mass Index (BMI) 40.0 and over, adult: Secondary | ICD-10-CM | POA: Insufficient documentation

## 2022-10-07 DIAGNOSIS — G4730 Sleep apnea, unspecified: Secondary | ICD-10-CM | POA: Diagnosis not present

## 2022-10-07 DIAGNOSIS — M109 Gout, unspecified: Secondary | ICD-10-CM | POA: Diagnosis not present

## 2022-10-07 DIAGNOSIS — Z9049 Acquired absence of other specified parts of digestive tract: Secondary | ICD-10-CM | POA: Diagnosis not present

## 2022-10-07 DIAGNOSIS — Z1211 Encounter for screening for malignant neoplasm of colon: Secondary | ICD-10-CM | POA: Diagnosis present

## 2022-10-07 DIAGNOSIS — F17210 Nicotine dependence, cigarettes, uncomplicated: Secondary | ICD-10-CM | POA: Diagnosis not present

## 2022-10-07 DIAGNOSIS — D490 Neoplasm of unspecified behavior of digestive system: Secondary | ICD-10-CM | POA: Diagnosis not present

## 2022-10-07 DIAGNOSIS — K620 Anal polyp: Secondary | ICD-10-CM

## 2022-10-07 HISTORY — PX: COLONOSCOPY WITH PROPOFOL: SHX5780

## 2022-10-07 HISTORY — DX: Gout, unspecified: M10.9

## 2022-10-07 HISTORY — DX: Sleep apnea, unspecified: G47.30

## 2022-10-07 HISTORY — DX: Morbid (severe) obesity due to excess calories: E66.01

## 2022-10-07 HISTORY — PX: POLYPECTOMY: SHX5525

## 2022-10-07 HISTORY — DX: Edema, unspecified: R60.9

## 2022-10-07 SURGERY — COLONOSCOPY WITH PROPOFOL
Anesthesia: General

## 2022-10-07 MED ORDER — PROPOFOL 1000 MG/100ML IV EMUL
INTRAVENOUS | Status: AC
  Filled 2022-10-07: qty 100

## 2022-10-07 MED ORDER — PROPOFOL 500 MG/50ML IV EMUL
INTRAVENOUS | Status: DC | PRN
  Administered 2022-10-07: 200 ug/kg/min via INTRAVENOUS
  Administered 2022-10-07: 50 mg via INTRAVENOUS

## 2022-10-07 MED ORDER — DEXMEDETOMIDINE HCL IN NACL 200 MCG/50ML IV SOLN
INTRAVENOUS | Status: DC | PRN
  Administered 2022-10-07 (×2): 4 ug via INTRAVENOUS
  Administered 2022-10-07: 8 ug via INTRAVENOUS
  Administered 2022-10-07: 4 ug via INTRAVENOUS

## 2022-10-07 MED ORDER — SODIUM CHLORIDE 0.9 % IV SOLN: INTRAVENOUS | Status: DC

## 2022-10-07 MED ORDER — LIDOCAINE HCL (CARDIAC) PF 100 MG/5ML IV SOSY
PREFILLED_SYRINGE | INTRAVENOUS | Status: DC | PRN
  Administered 2022-10-07: 100 mg via INTRAVENOUS

## 2022-10-07 MED ORDER — ONDANSETRON HCL 4 MG/2ML IJ SOLN
INTRAMUSCULAR | Status: DC | PRN
  Administered 2022-10-07: 4 mg via INTRAVENOUS

## 2022-10-07 MED ORDER — PROPOFOL 10 MG/ML IV BOLUS
INTRAVENOUS | Status: AC
  Filled 2022-10-07: qty 40

## 2022-10-07 MED ORDER — PROPOFOL 10 MG/ML IV BOLUS
INTRAVENOUS | Status: AC
  Filled 2022-10-07: qty 20

## 2022-10-07 MED ORDER — SODIUM CHLORIDE 0.9 % IV SOLN: INTRAVENOUS | Status: DC | PRN

## 2022-10-07 MED ORDER — STERILE WATER FOR IRRIGATION IR SOLN
Status: DC | PRN
  Administered 2022-10-07: 100 mL

## 2022-10-07 NOTE — Anesthesia Preprocedure Evaluation (Signed)
Anesthesia Evaluation  Patient identified by MRN, date of birth, ID band Patient awake    Reviewed: Allergy & Precautions, NPO status , Patient's Chart, lab work & pertinent test results  History of Anesthesia Complications Negative for: history of anesthetic complications  Airway Mallampati: III  TM Distance: <3 FB Neck ROM: full    Dental  (+) Chipped, Poor Dentition   Pulmonary neg shortness of breath, sleep apnea , Current Smoker and Patient abstained from smoking.   Pulmonary exam normal        Cardiovascular Exercise Tolerance: Good hypertension, (-) angina Normal cardiovascular exam     Neuro/Psych negative neurological ROS  negative psych ROS   GI/Hepatic negative GI ROS, Neg liver ROS,,,  Endo/Other    Morbid obesity  Renal/GU negative Renal ROS  negative genitourinary   Musculoskeletal   Abdominal   Peds  Hematology negative hematology ROS (+)   Anesthesia Other Findings Past Medical History: No date: Edema     Comment:  ble No date: Gout No date: Hypertension  Past Surgical History: No date: CHOLECYSTECTOMY No date: HERNIA REPAIR     Reproductive/Obstetrics negative OB ROS                             Anesthesia Physical Anesthesia Plan  ASA: 3  Anesthesia Plan: General   Post-op Pain Management:    Induction: Intravenous  PONV Risk Score and Plan: Propofol infusion and TIVA  Airway Management Planned: Natural Airway and Nasal Cannula  Additional Equipment:   Intra-op Plan:   Post-operative Plan:   Informed Consent: I have reviewed the patients History and Physical, chart, labs and discussed the procedure including the risks, benefits and alternatives for the proposed anesthesia with the patient or authorized representative who has indicated his/her understanding and acceptance.     Dental Advisory Given  Plan Discussed with: Anesthesiologist, CRNA  and Surgeon  Anesthesia Plan Comments: (Patient consented for risks of anesthesia including but not limited to:  - adverse reactions to medications - risk of airway placement if required - damage to eyes, teeth, lips or other oral mucosa - nerve damage due to positioning  - sore throat or hoarseness - Damage to heart, brain, nerves, lungs, other parts of body or loss of life  Patient voiced understanding.)       Anesthesia Quick Evaluation

## 2022-10-07 NOTE — Anesthesia Postprocedure Evaluation (Signed)
Anesthesia Post Note  Patient: Joseph Waters  Procedure(s) Performed: COLONOSCOPY WITH PROPOFOL POLYPECTOMY  Patient location during evaluation: PACU Anesthesia Type: General Level of consciousness: awake and alert Pain management: pain level controlled Vital Signs Assessment: post-procedure vital signs reviewed and stable Respiratory status: spontaneous breathing, nonlabored ventilation, respiratory function stable and patient connected to nasal cannula oxygen Cardiovascular status: blood pressure returned to baseline and stable Postop Assessment: no apparent nausea or vomiting Anesthetic complications: no   No notable events documented.   Last Vitals:  Vitals:   10/07/22 0822 10/07/22 0832  BP:  136/79  Pulse:    Resp:    Temp: (!) 36.1 C   SpO2:  96%    Last Pain:  Vitals:   10/07/22 0841  TempSrc:   PainSc: 0-No pain                 Cleda Mccreedy Kassey Laforest

## 2022-10-07 NOTE — Transfer of Care (Signed)
Immediate Anesthesia Transfer of Care Note  Patient: [name]  Procedure(s) Performed: COLONOSCOPY WITH PROPOFOL POLYPECTOMY  Patient Location: PACU  Anesthesia Type:MAC  Level of Consciousness: awake  Airway & Oxygen Therapy: Patient Spontanous Breathing  Post-op Assessment: Report given to RN and Post -op Vital signs reviewed and stable  Post vital signs: Reviewed  Last Vitals:  Vitals Value Taken Time  BP 122/57 10/07/22 0813  Temp 72F   Pulse 90 10/07/22 0815  Resp 19 10/07/22 0815  SpO2 96 % 10/07/22 0815  Vitals shown include unfiled device data.  Last Pain:  Vitals:   10/07/22 0812  TempSrc:   PainSc: 0-No pain         Complications: No notable events documented.

## 2022-10-07 NOTE — Op Note (Signed)
[facility] Gastroenterology Patient Name: Joseph Waters Procedure Date: 10/07/2022 7:14 AM MRN: [id number] Account #: [id number] Date of Birth: [birth year] Admit Type: Outpatient Age: 47 Room: [facility] ENDO ROOM 2 Gender: Male Note Status: Finalized Instrument Name: Prentice Docker [id number] Procedure:             Colonoscopy Indications:           Screening for colorectal malignant neoplasm Providers:             Wyline Mood MD, MD Referring MD:          Durward Fortes. Hamrick (Referring MD) Medicines:             Monitored Anesthesia Care Complications:         No immediate complications. Procedure:             Pre-Anesthesia Assessment:                        - Prior to the procedure, a History and Physical was                         performed, and patient medications, allergies and                         sensitivities were reviewed. The patient's tolerance                         of previous anesthesia was reviewed.                        - The risks and benefits of the procedure and the                         sedation options and risks were discussed with the                         patient. All questions were answered and informed                         consent was obtained.                        - ASA Grade Assessment: II - A patient with mild                         systemic disease.                        After obtaining informed consent, the colonoscope was                         passed under direct vision. Throughout the procedure,                         the patient's blood pressure, pulse, and oxygen                         saturations were monitored continuously. The                         Colonoscope was introduced  through the anus and                         advanced to the the cecum, identified by the                         appendiceal orifice. The colonoscopy was performed                         with ease. The patient tolerated the procedure well.                          The quality of the bowel preparation was adequate. The                         ileocecal valve, appendiceal orifice, and rectum were                         photographed. Findings:      The perianal and digital rectal examinations were normal.      Two sessile polyps were found in the rectum and ascending colon. The       polyps were 5 to 6 mm in size. These polyps were removed with a cold       snare. Resection and retrieval were complete.      A large polypoid lesion was found at the anus. The lesion was       semi-pedunculated. No bleeding was present. not resected as it was in       anal canal      The exam was otherwise without abnormality on direct and retroflexion       views. Impression:            - Two 5 to 6 mm polyps in the rectum and in the                         ascending colon, removed with a cold snare. Resected                         and retrieved.                        - Polypoid lesion at the anus.                        - The examination was otherwise normal on direct and                         retroflexion views. Recommendation:        - Discharge patient to home (with escort).                        - Resume previous diet.                        - Continue present medications.                        - Await pathology results.                        -  Repeat colonoscopy for surveillance based on                         pathology results.                        - Refer to Dr Aleen Campi to resect polypod lesions in                         anal canal Procedure Code(s):     --- Professional ---                        [phone], Colonoscopy, flexible; with removal of                         tumor(s), polyp(s), or other lesion(s) by snare                         technique Diagnosis Code(s):     --- Professional ---                        Z12.11, Encounter for screening for malignant neoplasm                         of colon                         D12.8, Benign neoplasm of rectum                        D12.2, Benign neoplasm of ascending colon                        D49.0, Neoplasm of unspecified behavior of digestive                         system CPT copyright 2022 American Medical Association. All rights reserved. The codes documented in this report are preliminary and upon coder review may  be revised to meet current compliance requirements. Wyline Mood, MD Wyline Mood MD, MD 10/07/2022 8:08:55 AM This report has been signed electronically. Number of Addenda: 0 Note Initiated On: 10/07/2022 7:14 AM Scope Withdrawal Time: 0 hours 11 minutes 14 seconds  Total Procedure Duration: 0 hours 14 minutes 17 seconds  Estimated Blood Loss:  Estimated blood loss: none.      [facility]

## 2022-10-07 NOTE — H&P (Signed)
Wyline Mood, MD [address], [address], [place], [place], [address] [address], [address], [place], [place], [address] Phone: [phone]  Fax: [phone]  Primary Care Physician:  Ailene Ravel, MD   Pre-Procedure History & Physical: HPI:  Joseph Waters is a 47 y.o. male is here for an colonoscopy.   Past Medical History:  Diagnosis Date   Edema    ble   Gout    Hypertension    Morbid obesity with BMI of 50.0-59.9, adult (HCC)    Sleep apnea     Past Surgical History:  Procedure Laterality Date   CHOLECYSTECTOMY     HERNIA REPAIR      Prior to Admission medications   Medication Sig Start Date End Date Taking? Authorizing Provider  amLODipine (NORVASC) 5 MG tablet Take 5 mg by mouth daily.   Yes [provider]  colchicine 0.6 MG tablet Take 0.6 mg by mouth daily as needed.   Yes [provider]  amLODipine (NORVASC) 5 MG tablet Take 1 tablet (5 mg total) by mouth in the morning and at bedtime. 11/11/21 12/11/21  Orvil Feil, PA-C  Aspirin-Acetaminophen-Caffeine (GOODYS EXTRA STRENGTH) [phone] MG PACK Take 1-2 packets by mouth daily as needed (pain).    [provider]  furosemide (LASIX) 20 MG tablet Take 1 tablet (20 mg total) by mouth daily for 7 days. 07/24/21 07/31/21  Chesley Noon, MD  pravastatin (PRAVACHOL) 40 MG tablet Take 40 mg by mouth at bedtime. For 30 days. 08/24/18 09/24/19  [provider]    Allergies as of 08/18/2022   (No Known Allergies)    History reviewed. No pertinent family history.  Social History   Socioeconomic History   Marital status: Married    Spouse name: Not on file   Number of children: Not on file   Years of education: Not on file   Highest education level: Not on file  Occupational History   Not on file  Tobacco Use   Smoking status: Every Day   Smokeless tobacco: Never  Vaping Use   Vaping status: Never Used  Substance and Sexual Activity   Alcohol use: Yes     Comment: "couple beers daily",none last 24hrs   Drug use: Yes    Types: Marijuana   Sexual activity: Not on file  Other Topics Concern   Not on file  Social History Narrative   Lives with wife 52yrs   Social Determinants of Health   Financial Resource Strain: Not on file  Food Insecurity: Not on file  Transportation Needs: Not on file  Physical Activity: Not on file  Stress: Not on file  Social Connections: Unknown (07/16/2021)   Received from [facility]   Social Network    Social Network: Not on file  Intimate Partner Violence: Unknown (06/06/2021)   Received from [facility]   HITS    Physically Hurt: Not on file    Insult or Talk Down To: Not on file    Threaten Physical Harm: Not on file    Scream or Curse: Not on file    Review of Systems: See HPI, otherwise negative ROS  Physical Exam: BP (!) 156/89   Pulse 80   Temp 97.6 F (36.4 C) (Temporal)   Resp 20   Ht 6' (1.829 m)   Wt (!) 192.4 kg   SpO2 97%   BMI 57.53 kg/m  General:   Alert,  pleasant and cooperative in NAD Head:  Normocephalic  and atraumatic. Neck:  Supple; no masses or thyromegaly. Lungs:  Clear throughout to auscultation, normal respiratory effort.    Heart:  +S1, +S2, Regular rate and rhythm, No edema. Abdomen:  Soft, nontender and nondistended. Normal bowel sounds, without guarding, and without rebound.   Neurologic:  Alert and  oriented x4;  grossly normal neurologically.  Impression/Plan: Joseph Waters is here for an colonoscopy to be performed for Screening colonoscopy average risk   Risks, benefits, limitations, and alternatives regarding  colonoscopy have been reviewed with the patient.  Questions have been answered.  All parties agreeable.   Wyline Mood, MD  10/07/2022, 7:44 AM

## 2022-10-08 ENCOUNTER — Encounter: Payer: Self-pay | Admitting: Gastroenterology

## 2022-10-13 ENCOUNTER — Encounter: Payer: Self-pay | Admitting: Gastroenterology

## 2022-10-15 ENCOUNTER — Encounter: Payer: Self-pay | Admitting: Surgery

## 2022-10-15 ENCOUNTER — Ambulatory Visit (INDEPENDENT_AMBULATORY_CARE_PROVIDER_SITE_OTHER): Payer: Medicaid Other | Admitting: Surgery

## 2022-10-15 VITALS — BP 182/102 | HR 87 | Temp 98.8°F | Ht 72.0 in | Wt >= 6400 oz

## 2022-10-15 DIAGNOSIS — K620 Anal polyp: Secondary | ICD-10-CM | POA: Diagnosis not present

## 2022-10-15 NOTE — H&P (View-Only) (Signed)
 10/15/2022  Reason for Visit: Anal polyp  Requesting Provider: Wyline Mood, MD  History of Present Illness: Joseph Waters is a 47 y.o. male presenting for evaluation of an anal polyp.  The patient had a colonoscopy on 10/07/2022.  Colonoscopy found 2 sessile polyps in the ascending colon and rectum.  However it showed a large polypoid lesion at the anus which was semipedunculated.  Biopsies were done of this polyp.  Given its location, resection was not attempted.  The patient reports that he had gallbladder surgery about 20 years ago and has had issues with loose stools since then.  He denies any constipation.  He has had issues with external hemorrhoids in the past which are minor flares that are responding appropriately with Preparation H as needed.  Has noticed sometimes some bleeding but is unclear whether this could be from hemorrhoids or from the mass in his anal canal.  He does feel a sense of urgency when having bowel movements and also of incomplete emptying of stools and feels that he needs to strain more in order to push everything out.  He does feel something that can protrude out of the anal opening and bowel movements.  Past Medical History: Past Medical History:  Diagnosis Date   Edema    ble   Gout    Hypertension    Morbid obesity with BMI of 50.0-59.9, adult (HCC)    Sleep apnea      Past Surgical History: Past Surgical History:  Procedure Laterality Date   CHOLECYSTECTOMY     COLONOSCOPY WITH PROPOFOL N/A 10/07/2022   Procedure: COLONOSCOPY WITH PROPOFOL;  Surgeon: Wyline Mood, MD;  Location: [facility] ENDOSCOPY;  Service: Gastroenterology;  Laterality: N/A;   HERNIA REPAIR     POLYPECTOMY  10/07/2022   Procedure: POLYPECTOMY;  Surgeon: Wyline Mood, MD;  Location: [facility] ENDOSCOPY;  Service: Gastroenterology;;    Home Medications: Prior to Admission medications   Medication Sig Start Date End Date Taking? Authorizing Provider  amLODipine (NORVASC) 5 MG tablet Take 5 mg by  mouth daily.   Yes [provider]  colchicine 0.6 MG tablet Take 0.6 mg by mouth daily as needed.   Yes [provider]  pravastatin (PRAVACHOL) 40 MG tablet Take 40 mg by mouth at bedtime. For 30 days. 08/24/18 09/24/19  [provider]    Allergies: No Known Allergies  Social History:  reports that he has been smoking. He has never used smokeless tobacco. He reports current alcohol use. He reports current drug use. Drug: Marijuana.   Family History: History reviewed. No pertinent family history.  Review of Systems: Review of Systems  Constitutional:  Negative for chills and fever.  Respiratory:  Negative for shortness of breath.   Cardiovascular:  Negative for chest pain.  Gastrointestinal:  Positive for blood in stool. Negative for abdominal pain, constipation, nausea and vomiting.    Physical Exam BP (!) 182/102   Pulse 87   Temp 98.8 F (37.1 C) (Oral)   Ht 6' (1.829 m)   Wt (!) 423 lb (191.9 kg)   SpO2 92%   BMI 57.37 kg/m  CONSTITUTIONAL: No acute distress HEENT:  Normocephalic, atraumatic, extraocular motion intact. RESPIRATORY:  Lungs are clear, and breath sounds are equal bilaterally. Normal respiratory effort without pathologic use of accessory muscles. CARDIOVASCULAR: Heart is regular without murmurs, gallops, or rubs. RECTAL: External exam reveals enlarged external hemorrhoids of all 3 columns.  These are soft and are currently inflamed.  On digital rectal exam, he  has some mild enlargement of internal hemorrhoids but he has a clearly palpable mass posteriorly about 4 to 5 cm from the anal verge.  This does feel pedunculated as can be seen on his colonoscopy.  No gross blood noted. MUSCULOSKELETAL:  Normal muscle strength and tone in all four extremities.  No peripheral edema or cyanosis. NEUROLOGIC:  Motor and sensation is grossly normal.  Cranial nerves are grossly intact. PSYCH:  Alert and oriented to person, place and time. Affect is  normal.  Laboratory Analysis: Labs from 08/29/2022: Sodium 139, potassium 4.1, chloride 105, CO2 25, BUN 17, creatinine 0.9.  LFTs within normal limits.  WBC 10.4, hemoglobin 16.8, hematocrit 50.6, platelets 204.  Imaging: Colonoscopy on 10/07/2022: Impression:  - Two 5 to 6 mm polyps in the rectum and in the ascending colon, removed with a cold snare. Resected and retrieved.  - Polypoid lesion at the anus.  - The examination was otherwise normal on direct and retroflexion views.  Assessment and Plan: This is a 47 y.o. male with an anal canal polyp.  - Discussed with the patient the findings on exam today.  The mass in the anal canal that was seen on his colonoscopy is clearly palpable on today's exam.  This is about 4 to 5 cm from the anal verge posteriorly.  Discussed with him that although he does have enlarged external hemorrhoids, given that these have not been giving him any major issues, we can strictly focused on the anal mass itself.  Discussed with him that although hemorrhoid surgery is feasible, there is also a lot of pain afterwards from this and as such for now we can avoid this since is not really his primary concern.  With regards to the mass, we can remove this in the operating room under anesthesia so that we can better evaluate the anal canal and excise this mass completely.  He is in agreement. - Discussed with him then the plan for an exam under anesthesia with excision of the anal canal polyp.  Reviewed with him the surgery at length including the positioning, risks of bleeding, infection, injury to surrounding structures, that this would be an outpatient surgery, potential repair of the posterior anal canal depending on the depth of this mass, postoperative pain control, and he is willing to proceed. - We will schedule him for surgery on 10/28/2022.  All of his questions have been answered.  I spent 30 minutes dedicated to the care of this patient on the date of this encounter  to include pre-visit review of records, face-to-face time with the patient discussing diagnosis and management, and any post-visit coordination of care.   [name], MD  [facility]

## 2022-10-15 NOTE — Patient Instructions (Signed)
You have requested to have rectal surgery today. This will be scheduled at [facility] with Dr Aleen Campi.  Please review the information below and your [facility] Information. Our surgery scheduler will call you to review surgery date and to go over information.  If you have FMLA or disability paperwork that needs filled out you may drop this off at our office or this can be faxed to [phone].  You will be required to do 2 enemas prior to your surgery. The first will be the night prior and the second will be done the morning of surgery.  Constipation is going to be your biggest obstacle following surgery. Use all stool softeners and laxatives as prescribed after your surgery and be sure to drink 72 ounces of water or more every day. This will help to avoid constipation. If you do all of this and you are still having difficulty, please call our office for further instructions as soon as you begin to have difficulty with bowel movements.  You may want to buy a disposable Sitz bath prior to surgery to aid in pain and cleanliness after surgery. The information on how to do use a disposable sitz bath or using a bath tub are below.  You will be out of work 1-2 weeks depending on how your healing goes. If you have FMLA/Disability paperwork that needs filled out you may drop this off at the office or fax it to [phone].   Rectal Surgery After Care Refer to this sheet in the next few weeks. These instructions provide you with information about caring for yourself after your procedure. Your health care provider may also give you more specific instructions. Your treatment has been planned according to current medical practices, but problems sometimes occur. Call your health care provider if you have any problems or questions after your procedure. What can I expect after the procedure? After the procedure, it is common to have: Rectal pain. Pain when you are having a bowel movement. Slight rectal  bleeding.  Follow these instructions at home: Medicines Take over-the-counter and prescription medicines only as told by your health care provider. Do not drive or operate heavy machinery while taking prescription pain medicine. Use a stool softener or a bulk laxative as told by your health care provider. Activity Rest at home. Return to your normal activities as told by your health care provider. Do not lift anything that is heavier than 10 lb (4.5 kg). Do not sit for long periods of time. Take a walk every day or as told by your health care provider. Do not strain to have a bowel movement. Do not spend a long time sitting on the toilet. Eating and drinking Eat foods that contain fiber, such as whole grains, beans, nuts, fruits, and vegetables. Drink enough fluid to keep your urine clear or pale yellow. General instructions Sit in a warm bath 2-3 times per day to relieve soreness or itching. Keep all follow-up visits as told by your health care provider. This is important. Contact a health care provider if: Your pain medicine is not helping. You have a fever or chills. You become constipated. You have trouble passing urine. Get help right away if: You have very bad rectal pain. You have heavy bleeding from your rectum. This information is not intended to replace advice given to you by your health care provider. Make sure you discuss any questions you have with your health care provider. Document Released: 05/10/2003 Document Revised: 07/26/2015 Document Reviewed: 05/15/2014 Elsevier Interactive  Patient Education  2018 Elsevier Inc.   Disposable Sitz Bath A disposable sitz bath is a plastic basin that fits over the toilet. A bag is hung above the toilet, and the bag is connected to a tube that opens into the basin. The bag is filled with warm water that flows into the basin through the tube. A sitz bath can be used to help relieve symptoms, clean, and promote healing in the genital  and anal areas, as well as in the lower abdomen and buttocks. What are the risks? Sitz baths are generally very safe. It is possible for the skin between the genitals and the anus (perineum) to become infected, but this is rare. You can avoid this by cleaning your sitz bath supplies thoroughly. How to use a disposable sitz bath Close the clamp on the tube. Make sure the clamp is closed tightly to prevent leakage. Fill the sitz bath basin and the plastic bag with warm water. The water should be warm enough to be comfortable, but not hot. Raise the toilet seat and place the filled basin on the toilet. Make sure the overflow opening is facing toward the back of the toilet. If you prefer, you may place the empty basin on the toilet first, and then use the plastic bag to fill the basin with warm water. Hang the filled plastic bag overhead on a hook or towel rack close to the toilet. The bag should be higher than the toilet so that the water will flow down through the tube. Attach the tube to the opening on the basin. Make sure that the tube is attached to the basin tightly to prevent leakage. Sit on the basin and release the clamp. This will allow warm water to flow into the basin and flush the area around your genitals and anus. Remain sitting on the basin for about 15-20 minutes, or as long as told by your health care provider. Stand up and gently pat your skin dry. If directed, apply clean bandages (dressings) to the affected area as told by your health care provider. Carefully remove the basin from the toilet seat and tip the basin into the toilet to empty any remaining water. Empty any remaining water from the plastic bag into the toilet. Then, flush the toilet. Wash the basin with warm water and soap. Let the basin air dry in the sink. You should also let the plastic bag and the tubing air dry. Store the basin, tubing, and plastic bag in a clean, dry area. Wash your hands with soap and water. If  soap and water are not available, use hand sanitizer. Contact a health care provider if: You have symptoms that get worse instead of better. You develop new skin irritation, redness, or swelling around your genitals or anus. This information is not intended to replace advice given to you by your health care provider. Make sure you discuss any questions you have with your health care provider. Document Released: 08/19/2011 Document Revised: 07/26/2015 Document Reviewed: 01/07/2015 Elsevier Interactive Patient Education  2018 [employer].   How to Take a [employer] A sitz bath is a warm water bath that is taken while you are sitting down. The water should only come up to your hips and should cover your buttocks. Your health care provider may recommend a sitz bath to help you: Clean the lower part of your body, including your genital area. With itching. With pain. With sore muscles or muscles that tighten or spasm.  How to  take a sitz bath Take 3-4 sitz baths per day or as told by your health care provider. Partially fill a bathtub with warm water. You will only need the water to be deep enough to cover your hips and buttocks when you are sitting in it. If your health care provider told you to put medicine in the water, follow the directions exactly. Sit in the water and open the tub drain a little. Turn on the warm water again to keep the tub at the correct level. Keep the water running constantly. Soak in the water for 15-20 minutes or as told by your health care provider. After the sitz bath, pat the affected area dry first. Do not rub it. Be careful when you stand up after the sitz bath because you may feel dizzy.  Contact a health care provider if: Your symptoms get worse. Do not continue with sitz baths if your symptoms get worse. You have new symptoms. Do not continue with sitz baths until you talk with your health care provider. This information is not intended to replace advice  given to you by your health care provider. Make sure you discuss any questions you have with your health care provider. Document Released: 11/10/2003 Document Revised: 07/18/2015 Document Reviewed: 02/15/2014 Elsevier Interactive Patient Education  [employer].

## 2022-10-15 NOTE — Progress Notes (Signed)
10/15/2022  Reason for Visit: Anal polyp  Requesting Provider: Wyline Mood, MD  History of Present Illness: Joseph Waters is a 47 y.o. male presenting for evaluation of an anal polyp.  The patient had a colonoscopy on 10/07/2022.  Colonoscopy found 2 sessile polyps in the ascending colon and rectum.  However it showed a large polypoid lesion at the anus which was semipedunculated.  Biopsies were done of this polyp.  Given its location, resection was not attempted.  The patient reports that he had gallbladder surgery about 20 years ago and has had issues with loose stools since then.  He denies any constipation.  He has had issues with external hemorrhoids in the past which are minor flares that are responding appropriately with Preparation H as needed.  Has noticed sometimes some bleeding but is unclear whether this could be from hemorrhoids or from the mass in his anal canal.  He does feel a sense of urgency when having bowel movements and also of incomplete emptying of stools and feels that he needs to strain more in order to push everything out.  He does feel something that can protrude out of the anal opening and bowel movements.  Past Medical History: Past Medical History:  Diagnosis Date   Edema    ble   Gout    Hypertension    Morbid obesity with BMI of 50.0-59.9, adult (HCC)    Sleep apnea      Past Surgical History: Past Surgical History:  Procedure Laterality Date   CHOLECYSTECTOMY     COLONOSCOPY WITH PROPOFOL N/A 10/07/2022   Procedure: COLONOSCOPY WITH PROPOFOL;  Surgeon: Joseph Mood, MD;  Location: [facility] ENDOSCOPY;  Service: Gastroenterology;  Laterality: N/A;   HERNIA REPAIR     POLYPECTOMY  10/07/2022   Procedure: POLYPECTOMY;  Surgeon: Joseph Mood, MD;  Location: [facility] ENDOSCOPY;  Service: Gastroenterology;;    Home Medications: Prior to Admission medications   Medication Sig Start Date End Date Taking? Authorizing Provider  amLODipine (NORVASC) 5 MG tablet Take 5 mg by  mouth daily.   Yes [provider]  colchicine 0.6 MG tablet Take 0.6 mg by mouth daily as needed.   Yes [provider]  pravastatin (PRAVACHOL) 40 MG tablet Take 40 mg by mouth at bedtime. For 30 days. 08/24/18 09/24/19  [provider]    Allergies: No Known Allergies  Social History:  reports that he has been smoking. He has never used smokeless tobacco. He reports current alcohol use. He reports current drug use. Drug: Marijuana.   Family History: History reviewed. No pertinent family history.  Review of Systems: Review of Systems  Constitutional:  Negative for chills and fever.  Respiratory:  Negative for shortness of breath.   Cardiovascular:  Negative for chest pain.  Gastrointestinal:  Positive for blood in stool. Negative for abdominal pain, constipation, nausea and vomiting.    Physical Exam BP (!) 182/102   Pulse 87   Temp 98.8 F (37.1 C) (Oral)   Ht 6' (1.829 m)   Wt (!) 423 lb (191.9 kg)   SpO2 92%   BMI 57.37 kg/m  CONSTITUTIONAL: No acute distress HEENT:  Normocephalic, atraumatic, extraocular motion intact. RESPIRATORY:  Lungs are clear, and breath sounds are equal bilaterally. Normal respiratory effort without pathologic use of accessory muscles. CARDIOVASCULAR: Heart is regular without murmurs, gallops, or rubs. RECTAL: External exam reveals enlarged external hemorrhoids of all 3 columns.  These are soft and are currently inflamed.  On digital rectal exam, he  has some mild enlargement of internal hemorrhoids but he has a clearly palpable mass posteriorly about 4 to 5 cm from the anal verge.  This does feel pedunculated as can be seen on his colonoscopy.  No gross blood noted. MUSCULOSKELETAL:  Normal muscle strength and tone in all four extremities.  No peripheral edema or cyanosis. NEUROLOGIC:  Motor and sensation is grossly normal.  Cranial nerves are grossly intact. PSYCH:  Alert and oriented to person, place and time. Affect is  normal.  Laboratory Analysis: Labs from 08/29/2022: Sodium 139, potassium 4.1, chloride 105, CO2 25, BUN 17, creatinine 0.9.  LFTs within normal limits.  WBC 10.4, hemoglobin 16.8, hematocrit 50.6, platelets 204.  Imaging: Colonoscopy on 10/07/2022: Impression:  - Two 5 to 6 mm polyps in the rectum and in the ascending colon, removed with a cold snare. Resected and retrieved.  - Polypoid lesion at the anus.  - The examination was otherwise normal on direct and retroflexion views.  Assessment and Plan: This is a 47 y.o. male with an anal canal polyp.  - Discussed with the patient the findings on exam today.  The mass in the anal canal that was seen on his colonoscopy is clearly palpable on today's exam.  This is about 4 to 5 cm from the anal verge posteriorly.  Discussed with him that although he does have enlarged external hemorrhoids, given that these have not been giving him any major issues, we can strictly focused on the anal mass itself.  Discussed with him that although hemorrhoid surgery is feasible, there is also a lot of pain afterwards from this and as such for now we can avoid this since is not really his primary concern.  With regards to the mass, we can remove this in the operating room under anesthesia so that we can better evaluate the anal canal and excise this mass completely.  He is in agreement. - Discussed with him then the plan for an exam under anesthesia with excision of the anal canal polyp.  Reviewed with him the surgery at length including the positioning, risks of bleeding, infection, injury to surrounding structures, that this would be an outpatient surgery, potential repair of the posterior anal canal depending on the depth of this mass, postoperative pain control, and he is willing to proceed. - We will schedule him for surgery on 10/28/2022.  All of his questions have been answered.  I spent 30 minutes dedicated to the care of this patient on the date of this encounter  to include pre-visit review of records, face-to-face time with the patient discussing diagnosis and management, and any post-visit coordination of care.   [name], MD  [facility]

## 2022-10-16 ENCOUNTER — Telehealth: Payer: Self-pay | Admitting: Surgery

## 2022-10-16 NOTE — Telephone Encounter (Signed)
Left message for patient to call, please inform him of the following regarding scheduled surgery with Dr. Aleen Campi.   Pre-Admission date/time, and Surgery date at [facility].  Surgery Date: 10/28/22 Preadmission Testing Date: 10/20/22 (phone 1p-4p)  Also patient will need to call at [phone], between 1-3:00pm the day before surgery, to find out what time to arrive for surgery.

## 2022-10-20 ENCOUNTER — Other Ambulatory Visit: Payer: Self-pay

## 2022-10-20 ENCOUNTER — Encounter
Admission: RE | Admit: 2022-10-20 | Discharge: 2022-10-20 | Disposition: A | Discharge status: Home / Self Care | Payer: Medicaid Other | Source: Ambulatory Visit | Attending: Surgery | Admitting: Surgery

## 2022-10-20 HISTORY — DX: Cerebral infarction, unspecified: I63.9

## 2022-10-20 NOTE — Patient Instructions (Addendum)
Your procedure is scheduled on:  10/28/2022  Tuesday Report to the Registration Desk on the 1st floor of the [facility]. To find out your arrival time, please call [phone] between 1PM - 3PM on: 10/27/2022 Monday  If your arrival time is 6:00 am, do not arrive before that time as the [facility] entrance doors do not open until 6:00 am.  REMEMBER: Instructions that are not followed completely may result in serious medical risk, up to and including death; or upon the discretion of your surgeon and anesthesiologist your surgery may need to be rescheduled.  Do not eat food after midnight the night before surgery.  No gum chewing or hard candies.  You may however, drink CLEAR liquids up to 2 hours before you are scheduled to arrive for your surgery. Do not drink anything within 2 hours of your scheduled arrival time. Remember the cut off time.   Clear liquids include: - water  - apple juice without pulp - gatorade (not RED colors) - black coffee or tea (Do NOT add milk or creamers to the coffee or tea) Do NOT drink anything that is not on this list.   One week prior to surgery: Stop Anti-inflammatories (NSAIDS) such as Advil, Aleve, Ibuprofen, Motrin, Naproxen, Naprosyn and Aspirin based products such as Excedrin, Goody's Powder, BC Powder. Stop ANY OVER THE COUNTER supplements until after surgery. You may however, continue to take Tylenol if needed for pain up until the day of surgery.  Continue taking all prescribed medications.   Please administer  Fleets- enema or bowel prep as directed.     Do  1st enema before going to bed,  then shower with the antibacterial soap at night before surgery.     Do  the  2nd  enema, then shower with antibacterial soap in the am of surgery day.            It is important that you follow the direction on administering enema to get best results.              No Alcohol for 24 hours before or after surgery.  No Smoking including e-cigarettes  for 24 hours before surgery.  No chewable tobacco products for at least 6 hours before surgery.  No nicotine patches on the day of surgery.  Do not use any "recreational" drugs for at least a week (preferably 2 weeks) before your surgery.  Please be advised that the combination of cocaine and anesthesia may have negative outcomes, up to and including death. If you test positive for cocaine, your surgery will be cancelled.  On the morning of surgery brush your teeth with toothpaste and water, you may rinse your mouth with mouthwash if you wish. Do not swallow any toothpaste or mouthwash.  Use CHG Soap as directed on instruction sheet.  Do not wear jewelry, make-up, hairpins, clips or nail polish.  Do not wear lotions, powders, or perfumes.   Do not shave body hair from the neck down 48 hours before surgery.  Contact lenses, hearing aids and dentures may not be worn into surgery.  Do not bring valuables to the hospital. [facility] is not responsible for any missing/lost belongings or valuables.   Notify your doctor if there is any change in your medical condition (cold, fever, infection).  Wear comfortable clothing (specific to your surgery type) to the hospital.  After surgery, you can help prevent lung complications by doing breathing exercises.  Take deep breaths and cough every  1-2 hours. Your doctor may order a device called an Incentive Spirometer to help you take deep breaths. If you are being admitted to the hospital overnight, leave your suitcase in the car. After surgery it may be brought to your room.   If you are being discharged the day of surgery, you will not be allowed to drive home. You will need a responsible individual to drive you home and stay with you for 24 hours after surgery.   Please call the Pre-admissions Testing Dept. at [phone] if you have any questions about these instructions.  Surgery Visitation Policy:  Patients having surgery or a  procedure may have two visitors.  Children under the age of 70 must have an adult with them who is not the patient.     Preparing for Surgery with CHLORHEXIDINE GLUCONATE (CHG) Soap  Chlorhexidine Gluconate (CHG) Soap  o An antiseptic cleaner that kills germs and bonds with the skin to continue killing germs even after washing  o Used for showering the night before surgery and morning of surgery  Before surgery, you can play an important role by reducing the number of germs on your skin.  CHG (Chlorhexidine gluconate) soap is an antiseptic cleanser which kills germs and bonds with the skin to continue killing germs even after washing.  Please do not use if you have an allergy to CHG or antibacterial soaps. If your skin becomes reddened/irritated stop using the CHG.  1. Shower the NIGHT BEFORE SURGERY and the MORNING OF SURGERY with CHG soap.  2. If you choose to wash your hair, wash your hair first as usual with your normal shampoo.  3. After shampooing, rinse your hair and body thoroughly to remove the shampoo.  4. Use CHG as you would any other liquid soap. You can apply CHG directly to the skin and wash gently with a scrungie or a clean washcloth.  5. Apply the CHG soap to your body only from the neck down. Do not use on open wounds or open sores. Avoid contact with your eyes, ears, mouth, and genitals (private parts). Wash face and genitals (private parts) with your normal soap.  6. Wash thoroughly, paying special attention to the area where your surgery will be performed.  7. Thoroughly rinse your body with warm water.  8. Do not shower/wash with your normal soap after using and rinsing off the CHG soap.  9. Pat yourself dry with a clean towel.  10. Wear clean pajamas to bed the night before surgery.  12. Place clean sheets on your bed the night of your first shower and do not sleep with pets.  13. Shower again with the CHG soap on the day of surgery prior to arriving at  the hospital.  14. Do not apply any deodorants/lotions/powders.  15. Please wear clean clothes to the hospital.

## 2022-10-27 MED ORDER — BUPIVACAINE LIPOSOME 1.3 % IJ SUSP: 20.0000 mL | Freq: Once | INTRAMUSCULAR | Status: DC

## 2022-10-27 MED ORDER — GABAPENTIN 300 MG PO CAPS
300.0000 mg | ORAL_CAPSULE | ORAL | Status: AC
  Administered 2022-10-28: 300 mg via ORAL

## 2022-10-27 MED ORDER — FLEET ENEMA RE ENEM: 1.0000 | ENEMA | Freq: Once | RECTAL | Status: DC

## 2022-10-27 MED ORDER — ORAL CARE MOUTH RINSE: 15.0000 mL | Freq: Once | OROMUCOSAL | Status: AC

## 2022-10-27 MED ORDER — CHLORHEXIDINE GLUCONATE CLOTH 2 % EX PADS: 6.0000 | MEDICATED_PAD | Freq: Once | CUTANEOUS | Status: DC

## 2022-10-27 MED ORDER — CHLORHEXIDINE GLUCONATE 0.12 % MT SOLN
15.0000 mL | Freq: Once | OROMUCOSAL | Status: AC
  Administered 2022-10-28: 15 mL via OROMUCOSAL

## 2022-10-27 MED ORDER — SODIUM CHLORIDE 0.9 % IV SOLN
2.0000 g | INTRAVENOUS | Status: AC
  Administered 2022-10-28: 2 g via INTRAVENOUS

## 2022-10-27 MED ORDER — LACTATED RINGERS IV SOLN: INTRAVENOUS | Status: DC

## 2022-10-27 MED ORDER — ACETAMINOPHEN 500 MG PO TABS
1000.0000 mg | ORAL_TABLET | ORAL | Status: AC
  Administered 2022-10-28: 1000 mg via ORAL

## 2022-10-27 MED ORDER — FAMOTIDINE 20 MG PO TABS
20.0000 mg | ORAL_TABLET | Freq: Once | ORAL | Status: AC
  Administered 2022-10-28: 20 mg via ORAL

## 2022-10-28 ENCOUNTER — Encounter
Admission: RE | Disposition: A | Discharge status: Home / Self Care | Payer: Self-pay | Source: Home / Self Care | Attending: Surgery

## 2022-10-28 ENCOUNTER — Ambulatory Visit: Payer: Medicaid Other | Admitting: Certified Registered"

## 2022-10-28 ENCOUNTER — Ambulatory Visit
Admission: RE | Admit: 2022-10-28 | Discharge: 2022-10-28 | Disposition: A | Discharge status: Home / Self Care | Payer: Medicaid Other | Attending: Surgery | Admitting: Surgery

## 2022-10-28 ENCOUNTER — Other Ambulatory Visit: Payer: Self-pay

## 2022-10-28 ENCOUNTER — Encounter: Payer: Self-pay | Admitting: Surgery

## 2022-10-28 DIAGNOSIS — F17200 Nicotine dependence, unspecified, uncomplicated: Secondary | ICD-10-CM | POA: Insufficient documentation

## 2022-10-28 DIAGNOSIS — Z9049 Acquired absence of other specified parts of digestive tract: Secondary | ICD-10-CM | POA: Diagnosis not present

## 2022-10-28 DIAGNOSIS — Z6841 Body Mass Index (BMI) 40.0 and over, adult: Secondary | ICD-10-CM | POA: Diagnosis not present

## 2022-10-28 DIAGNOSIS — I10 Essential (primary) hypertension: Secondary | ICD-10-CM | POA: Insufficient documentation

## 2022-10-28 DIAGNOSIS — G4730 Sleep apnea, unspecified: Secondary | ICD-10-CM | POA: Diagnosis not present

## 2022-10-28 DIAGNOSIS — K620 Anal polyp: Secondary | ICD-10-CM

## 2022-10-28 DIAGNOSIS — A630 Anogenital (venereal) warts: Secondary | ICD-10-CM | POA: Insufficient documentation

## 2022-10-28 DIAGNOSIS — M109 Gout, unspecified: Secondary | ICD-10-CM | POA: Insufficient documentation

## 2022-10-28 HISTORY — PX: TUMOR EXCISION: SHX421

## 2022-10-28 SURGERY — EXAM UNDER ANESTHESIA
Anesthesia: General

## 2022-10-28 MED ORDER — SODIUM CHLORIDE 0.9 % IV SOLN
INTRAVENOUS | Status: AC
  Filled 2022-10-28: qty 2

## 2022-10-28 MED ORDER — LIDOCAINE 5 % EX OINT: 1.0000 | TOPICAL_OINTMENT | Freq: Four times a day (QID) | CUTANEOUS | 0 refills | Status: DC | PRN

## 2022-10-28 MED ORDER — ACETAMINOPHEN 500 MG PO TABS
ORAL_TABLET | ORAL | Status: AC
  Filled 2022-10-28: qty 2

## 2022-10-28 MED ORDER — PROPOFOL 1000 MG/100ML IV EMUL
INTRAVENOUS | Status: AC
  Filled 2022-10-28: qty 100

## 2022-10-28 MED ORDER — FENTANYL CITRATE (PF) 100 MCG/2ML IJ SOLN
INTRAMUSCULAR | Status: DC | PRN
  Administered 2022-10-28 (×2): 50 ug via INTRAVENOUS

## 2022-10-28 MED ORDER — SUCCINYLCHOLINE CHLORIDE 200 MG/10ML IV SOSY
PREFILLED_SYRINGE | INTRAVENOUS | Status: DC | PRN
  Administered 2022-10-28: 100 mg via INTRAVENOUS

## 2022-10-28 MED ORDER — MIDAZOLAM HCL 2 MG/2ML IJ SOLN
INTRAMUSCULAR | Status: AC
  Filled 2022-10-28: qty 2

## 2022-10-28 MED ORDER — OXYCODONE HCL 5 MG/5ML PO SOLN: 5.0000 mg | Freq: Once | ORAL | Status: AC | PRN

## 2022-10-28 MED ORDER — IBUPROFEN 600 MG PO TABS: 600.0000 mg | ORAL_TABLET | Freq: Three times a day (TID) | ORAL | 1 refills | Status: AC | PRN

## 2022-10-28 MED ORDER — ACETAMINOPHEN 500 MG PO TABS: 1000.0000 mg | ORAL_TABLET | Freq: Four times a day (QID) | ORAL | Status: AC | PRN

## 2022-10-28 MED ORDER — BUPIVACAINE LIPOSOME 1.3 % IJ SUSP
INTRAMUSCULAR | Status: AC
  Filled 2022-10-28: qty 20

## 2022-10-28 MED ORDER — HYDROMORPHONE HCL 1 MG/ML IJ SOLN
INTRAMUSCULAR | Status: AC
  Filled 2022-10-28: qty 1

## 2022-10-28 MED ORDER — PROPOFOL 10 MG/ML IV BOLUS
INTRAVENOUS | Status: DC | PRN
  Administered 2022-10-28: 50 mg via INTRAVENOUS
  Administered 2022-10-28: 200 mg via INTRAVENOUS
  Administered 2022-10-28: 50 mg via INTRAVENOUS

## 2022-10-28 MED ORDER — GELATIN ABSORBABLE 100 EX MISC
CUTANEOUS | Status: DC | PRN
  Administered 2022-10-28: 1 via TOPICAL

## 2022-10-28 MED ORDER — ACETAMINOPHEN 10 MG/ML IV SOLN: 1000.0000 mg | Freq: Once | INTRAVENOUS | Status: DC | PRN

## 2022-10-28 MED ORDER — BUPIVACAINE LIPOSOME 1.3 % IJ SUSP
INTRAMUSCULAR | Status: DC | PRN
  Administered 2022-10-28: 10 mL

## 2022-10-28 MED ORDER — GABAPENTIN 300 MG PO CAPS
ORAL_CAPSULE | ORAL | Status: AC
  Filled 2022-10-28: qty 1

## 2022-10-28 MED ORDER — DROPERIDOL 2.5 MG/ML IJ SOLN: 0.6250 mg | Freq: Once | INTRAMUSCULAR | Status: DC | PRN

## 2022-10-28 MED ORDER — OXYCODONE HCL 5 MG PO TABS: 5.0000 mg | ORAL_TABLET | Freq: Four times a day (QID) | ORAL | 0 refills | Status: DC | PRN

## 2022-10-28 MED ORDER — KETOROLAC TROMETHAMINE 15 MG/ML IJ SOLN
INTRAMUSCULAR | Status: AC
  Filled 2022-10-28: qty 1

## 2022-10-28 MED ORDER — PROMETHAZINE HCL 25 MG/ML IJ SOLN: 6.2500 mg | INTRAMUSCULAR | Status: DC | PRN

## 2022-10-28 MED ORDER — ALBUTEROL SULFATE HFA 108 (90 BASE) MCG/ACT IN AERS
INHALATION_SPRAY | RESPIRATORY_TRACT | Status: DC | PRN
Start: 2022-10-28 — End: 2022-10-28
  Administered 2022-10-28: 6 via RESPIRATORY_TRACT

## 2022-10-28 MED ORDER — LIDOCAINE HCL (CARDIAC) PF 100 MG/5ML IV SOSY
PREFILLED_SYRINGE | INTRAVENOUS | Status: DC | PRN
  Administered 2022-10-28: 100 mg via INTRAVENOUS

## 2022-10-28 MED ORDER — FAMOTIDINE 20 MG PO TABS
ORAL_TABLET | ORAL | Status: AC
  Filled 2022-10-28: qty 1

## 2022-10-28 MED ORDER — DEXAMETHASONE SODIUM PHOSPHATE 10 MG/ML IJ SOLN
INTRAMUSCULAR | Status: DC | PRN
  Administered 2022-10-28: 10 mg via INTRAVENOUS

## 2022-10-28 MED ORDER — ROCURONIUM BROMIDE 100 MG/10ML IV SOLN
INTRAVENOUS | Status: DC | PRN
  Administered 2022-10-28: 10 mg via INTRAVENOUS
  Administered 2022-10-28: 30 mg via INTRAVENOUS

## 2022-10-28 MED ORDER — SUGAMMADEX SODIUM 200 MG/2ML IV SOLN
INTRAVENOUS | Status: DC | PRN
  Administered 2022-10-28: 400 mg via INTRAVENOUS

## 2022-10-28 MED ORDER — ONDANSETRON HCL 4 MG/2ML IJ SOLN
INTRAMUSCULAR | Status: DC | PRN
  Administered 2022-10-28: 4 mg via INTRAVENOUS

## 2022-10-28 MED ORDER — BUPIVACAINE HCL (PF) 0.5 % IJ SOLN
INTRAMUSCULAR | Status: AC
  Filled 2022-10-28: qty 30

## 2022-10-28 MED ORDER — BUPIVACAINE-EPINEPHRINE 0.5% -1:200000 IJ SOLN
INTRAMUSCULAR | Status: DC | PRN
  Administered 2022-10-28: 30 mL

## 2022-10-28 MED ORDER — OXYCODONE HCL 5 MG PO TABS
ORAL_TABLET | ORAL | Status: AC
  Filled 2022-10-28: qty 1

## 2022-10-28 MED ORDER — SEVOFLURANE IN SOLN
RESPIRATORY_TRACT | Status: AC
  Filled 2022-10-28: qty 250

## 2022-10-28 MED ORDER — PHENYLEPHRINE HCL-NACL 20-0.9 MG/250ML-% IV SOLN
INTRAVENOUS | Status: AC
  Filled 2022-10-28: qty 250

## 2022-10-28 MED ORDER — DEXMEDETOMIDINE HCL IN NACL 200 MCG/50ML IV SOLN
INTRAVENOUS | Status: DC | PRN
  Administered 2022-10-28 (×2): 8 ug via INTRAVENOUS

## 2022-10-28 MED ORDER — GLYCOPYRROLATE 0.2 MG/ML IJ SOLN
INTRAMUSCULAR | Status: DC | PRN
  Administered 2022-10-28: .2 mg via INTRAVENOUS

## 2022-10-28 MED ORDER — LACTATED RINGERS IV SOLN: INTRAVENOUS | Status: DC | PRN

## 2022-10-28 MED ORDER — FENTANYL CITRATE (PF) 100 MCG/2ML IJ SOLN
INTRAMUSCULAR | Status: AC
  Filled 2022-10-28: qty 2

## 2022-10-28 MED ORDER — HYDROMORPHONE HCL 1 MG/ML IJ SOLN
INTRAMUSCULAR | Status: DC | PRN
  Administered 2022-10-28: .3 mg via INTRAVENOUS
  Administered 2022-10-28: .5 mg via INTRAVENOUS
  Administered 2022-10-28: .2 mg via INTRAVENOUS

## 2022-10-28 MED ORDER — CHLORHEXIDINE GLUCONATE 0.12 % MT SOLN
OROMUCOSAL | Status: AC
  Filled 2022-10-28: qty 15

## 2022-10-28 MED ORDER — KETOROLAC TROMETHAMINE 15 MG/ML IJ SOLN
15.0000 mg | INTRAMUSCULAR | Status: AC
  Administered 2022-10-28: 15 mg via INTRAVENOUS

## 2022-10-28 MED ORDER — MIDAZOLAM HCL 2 MG/2ML IJ SOLN
INTRAMUSCULAR | Status: DC | PRN
  Administered 2022-10-28: 2 mg via INTRAVENOUS

## 2022-10-28 MED ORDER — EPHEDRINE SULFATE (PRESSORS) 50 MG/ML IJ SOLN
INTRAMUSCULAR | Status: DC | PRN
  Administered 2022-10-28 (×3): 5 mg via INTRAVENOUS

## 2022-10-28 MED ORDER — GELATIN ABSORBABLE 100 CM EX MISC
CUTANEOUS | Status: AC
  Filled 2022-10-28: qty 1

## 2022-10-28 MED ORDER — EPINEPHRINE PF 1 MG/ML IJ SOLN
INTRAMUSCULAR | Status: AC
  Filled 2022-10-28: qty 1

## 2022-10-28 MED ORDER — FENTANYL CITRATE (PF) 100 MCG/2ML IJ SOLN
25.0000 ug | INTRAMUSCULAR | Status: DC | PRN
  Administered 2022-10-28 (×3): 25 ug via INTRAVENOUS

## 2022-10-28 MED ORDER — OXYCODONE HCL 5 MG PO TABS
5.0000 mg | ORAL_TABLET | Freq: Once | ORAL | Status: AC | PRN
  Administered 2022-10-28: 5 mg via ORAL

## 2022-10-28 SURGICAL SUPPLY — 34 items
BRIEF MESH DISP 2XL (UNDERPADS AND DIAPERS) ×1 IMPLANT
DRAPE PERI LITHO V/GYN (MISCELLANEOUS) ×1 IMPLANT
DRAPE UNDER BUTTOCK W/FLU (DRAPES) ×1 IMPLANT
DRSG GAUZE FLUFF 36X18 (GAUZE/BANDAGES/DRESSINGS) ×1 IMPLANT
ELECT CAUTERY BLADE TIP 2.5 (TIP) ×1
ELECT REM PT RETURN 9FT ADLT (ELECTROSURGICAL) ×1
ELECTRODE CAUTERY BLDE TIP 2.5 (TIP) ×1 IMPLANT
ELECTRODE REM PT RTRN 9FT ADLT (ELECTROSURGICAL) ×1 IMPLANT
GAUZE 4X4 16PLY ~~LOC~~+RFID DBL (SPONGE) IMPLANT
GLOVE SURG SYN 7.0 (GLOVE) ×1 IMPLANT
GLOVE SURG SYN 7.0 PF PI (GLOVE) ×1 IMPLANT
GLOVE SURG SYN 7.5 E (GLOVE) ×1 IMPLANT
GLOVE SURG SYN 7.5 PF PI (GLOVE) ×1 IMPLANT
GOWN STRL REUS W/ TWL LRG LVL3 (GOWN DISPOSABLE) ×2 IMPLANT
GOWN STRL REUS W/TWL LRG LVL3 (GOWN DISPOSABLE) ×2
KIT TURNOVER KIT A (KITS) ×1 IMPLANT
LABEL OR SOLS (LABEL) ×1 IMPLANT
MANIFOLD NEPTUNE II (INSTRUMENTS) ×1 IMPLANT
NDL HYPO 22X1.5 SAFETY MO (MISCELLANEOUS) ×1 IMPLANT
NEEDLE HYPO 22X1.5 SAFETY MO (MISCELLANEOUS) ×1 IMPLANT
NS IRRIG 500ML POUR BTL (IV SOLUTION) ×1 IMPLANT
PACK BASIN MINOR ARMC (MISCELLANEOUS) ×1 IMPLANT
PAD OB MATERNITY 4.3X12.25 (PERSONAL CARE ITEMS) ×1 IMPLANT
PAD PREP OB/GYN DISP 24X41 (PERSONAL CARE ITEMS) ×1 IMPLANT
SHEARS HARMONIC 9CM CVD (BLADE) IMPLANT
SOL PREP PVP 2OZ (MISCELLANEOUS) ×1
SOLUTION PREP PVP 2OZ (MISCELLANEOUS) ×1 IMPLANT
SURGILUBE 2OZ TUBE FLIPTOP (MISCELLANEOUS) ×1 IMPLANT
SUT VIC AB 2-0 SH 27 (SUTURE)
SUT VIC AB 2-0 SH 27XBRD (SUTURE) ×2 IMPLANT
SYR 10ML LL (SYRINGE) ×1 IMPLANT
SYR 20ML LL LF (SYRINGE) ×1 IMPLANT
TRAP FLUID SMOKE EVACUATOR (MISCELLANEOUS) ×1 IMPLANT
WATER STERILE IRR 500ML POUR (IV SOLUTION) ×1 IMPLANT

## 2022-10-28 NOTE — Interval H&P Note (Signed)
History and Physical Interval Note:  10/28/2022 7:06 AM  Joseph Waters  has presented today for surgery, with the diagnosis of anal polyp.  The various methods of treatment have been discussed with the patient and family. After consideration of risks, benefits and other options for treatment, the patient has consented to  Procedure(s): EXAM UNDER ANESTHESIA (N/A) TUMOR EXCISION RECTAL, polyp (N/A) as a surgical intervention.  The patient's history has been reviewed, patient examined, no change in status, stable for surgery.  I have reviewed the patient's chart and labs.  Questions were answered to the patient's satisfaction.     Afra Tricarico

## 2022-10-28 NOTE — Op Note (Signed)
Procedure Date:  10/28/2022  Pre-operative Diagnosis:  Posterior anal canal polyp  Post-operative Diagnosis: Posterior anal canal polyp  Procedure:  Exam under Anesthesia, excision of posterior anal canal polyp  Surgeon:  [name], MD  Anesthesia:  General endotracheal  Estimated Blood Loss:  10 ml  Specimens: Posterior anal canal polyp  Complications:  None  Indications for Procedure:  This is a 47 y.o. male with a history of an anal canal polyp which could not be removed during colonoscopy, now presenting for excision.  The risks of bleeding, infection, bowel injury, and need for further procedures were all discussed with the patient and he was willing to proceed.   Description of Procedure: The patient was correctly identified in the preoperative area and brought into the operating room.  The patient was placed supine with VTE prophylaxis in place.  Appropriate time-outs were performed.  Anesthesia was induced and the patient was intubated.  Appropriate antibiotics were infused.  The patient was then placed in high lithotomy position.   The perianal area was prepped and draped in usual sterile fashion.  The sphincter was digitally dilated.  The anal sphincter and canal were very tight.  Then the anoscope was inserted and the anal canal was evaluated, revealing a posterior anal canal polyp.  The bivalve retractor was then inserted, and even with this the visualization was limited.  The polyp was lifted with forceps and Harmonic was used to cut it at its base.  Hemostasis was controlled with the harmonic. The anal canal was irrigated.  40 ml of Exparel solution was infiltrated into the perianal area and as bilateral pudendal blocks.  A large gelfoam gauze was rolled and inserted into the anal canal for further hemostasis.  The perianal area was cleaned and dressed with fluffed gauze and mesh underwear.  The patient was emerged from anesthesia and extubated and brought to the  recovery room for further management.  The patient tolerated the procedure well and all counts were correct at the end of the case.   [name], MD

## 2022-10-28 NOTE — Anesthesia Preprocedure Evaluation (Signed)
Anesthesia Evaluation  Patient identified by MRN, date of birth, ID band Patient awake    Reviewed: Allergy & Precautions, H&P , NPO status , Patient's Chart, lab work & pertinent test results, reviewed documented beta blocker date and time   Airway Mallampati: II  TM Distance: >3 FB Neck ROM: full    Dental  (+) Teeth Intact   Pulmonary sleep apnea and Continuous Positive Airway Pressure Ventilation , Current Smoker and Patient abstained from smoking.   Pulmonary exam normal        Cardiovascular Exercise Tolerance: Good hypertension, On Medications negative cardio ROS Normal cardiovascular exam Rhythm:regular Rate:Normal     Neuro/Psych CVA  negative psych ROS   GI/Hepatic negative GI ROS, Neg liver ROS,,,  Endo/Other    Morbid obesity  Renal/GU negative Renal ROS  negative genitourinary   Musculoskeletal   Abdominal   Peds  Hematology negative hematology ROS (+)   Anesthesia Other Findings Past Medical History: No date: Edema     Comment:  ble No date: Gout No date: Hypertension No date: Morbid obesity with BMI of 50.0-59.9, adult (HCC) No date: Sleep apnea No date: Stroke [facility]) Past Surgical History: No date: CHOLECYSTECTOMY 10/07/2022: COLONOSCOPY WITH PROPOFOL; N/A     Comment:  Procedure: COLONOSCOPY WITH PROPOFOL;  Surgeon: Wyline Mood, MD;  Location: [facility] ENDOSCOPY;  Service:               Gastroenterology;  Laterality: N/A; No date: HERNIA REPAIR 10/07/2022: POLYPECTOMY     Comment:  Procedure: POLYPECTOMY;  Surgeon: Wyline Mood, MD;                Location: [facility] ENDOSCOPY;  Service: Gastroenterology;; BMI    Body Mass Index: 57.37 kg/m     Reproductive/Obstetrics negative OB ROS                             Anesthesia Physical Anesthesia Plan  ASA: 3  Anesthesia Plan: General ETT   Post-op Pain Management:    Induction:   PONV Risk Score and Plan:    Airway Management Planned:   Additional Equipment:   Intra-op Plan:   Post-operative Plan:   Informed Consent: I have reviewed the patients History and Physical, chart, labs and discussed the procedure including the risks, benefits and alternatives for the proposed anesthesia with the patient or authorized representative who has indicated his/her understanding and acceptance.     Dental Advisory Given  Plan Discussed with: CRNA  Anesthesia Plan Comments:        Anesthesia Quick Evaluation

## 2022-10-28 NOTE — Anesthesia Procedure Notes (Signed)
Procedure Name: Intubation Date/Time: 10/28/2022 7:58 AM  Performed by: Merlene Pulling, CRNAPre-anesthesia Checklist: Patient identified, Patient being monitored, Timeout performed, Emergency Drugs available and Suction available Patient Re-evaluated:Patient Re-evaluated prior to induction Oxygen Delivery Method: Circle system utilized Preoxygenation: Pre-oxygenation with 100% oxygen Induction Type: IV induction Ventilation: Mask ventilation without difficulty Laryngoscope Size: McGraph and 4 Grade View: Grade I Tube type: Oral Tube size: 7.5 mm Number of attempts: 1 Airway Equipment and Method: Stylet Placement Confirmation: ETT inserted through vocal cords under direct vision, positive ETCO2 and breath sounds checked- equal and bilateral Secured at: 25 cm Tube secured with: Tape Dental Injury: Teeth and Oropharynx as per pre-operative assessment

## 2022-10-28 NOTE — Anesthesia Postprocedure Evaluation (Signed)
Anesthesia Post Note  Patient: Joseph Waters  Procedure(s) Performed: EXAM UNDER ANESTHESIA TUMOR EXCISION RECTAL, polyp  Patient location during evaluation: PACU Anesthesia Type: General Level of consciousness: awake and alert Pain management: pain level controlled Vital Signs Assessment: post-procedure vital signs reviewed and stable Respiratory status: spontaneous breathing, nonlabored ventilation, respiratory function stable and patient connected to nasal cannula oxygen Cardiovascular status: blood pressure returned to baseline and stable Postop Assessment: no apparent nausea or vomiting Anesthetic complications: no   No notable events documented.   Last Vitals:  Vitals:   10/28/22 0920 10/28/22 0932  BP: (!) 173/96 (!) 149/87  Pulse: 86 77  Resp: 18 18  Temp:  36.6 C  SpO2: 97% 97%    Last Pain:  Vitals:   10/28/22 0932  TempSrc: Temporal  PainSc: 5                  Yevette Edwards

## 2022-10-28 NOTE — Discharge Instructions (Addendum)
Discharge Instructions: 1.  May do Sitz baths twice daily and/or after bowel movements 2.  May apply fluffed gauze over the perianal area for padding/comfort 3.  May start using Lidocaine ointment for perianal pain on 10/31/22.  Do not start it before. 4.  Do not drive while taking narcotics for pain control.  Prior to driving, make sure you are able to rotate right and left to look at blindspots without significant pain or discomfort. 5.  Avoid strenuous activity x 2 weeks.  AMBULATORY SURGERY  DISCHARGE INSTRUCTIONS   The drugs that you were given will stay in your system until tomorrow so for the next 24 hours you should not:  Drive an automobile Make any legal decisions Drink any alcoholic beverage   You may resume regular meals tomorrow.  Today it is better to start with liquids and gradually work up to solid foods.  You may eat anything you prefer, but it is better to start with liquids, then soup and crackers, and gradually work up to solid foods.   Please notify your doctor immediately if you have any unusual bleeding, trouble breathing, redness and pain at the surgery site, drainage, fever, or pain not relieved by medication.   Additional Instructions:  leave green armband on for 4 days

## 2022-10-28 NOTE — Transfer of Care (Signed)
Immediate Anesthesia Transfer of Care Note  Patient: Joseph Waters  Procedure(s) Performed: EXAM UNDER ANESTHESIA TUMOR EXCISION RECTAL, polyp  Patient Location: PACU  Anesthesia Type:General  Level of Consciousness: drowsy  Airway & Oxygen Therapy: Patient Spontanous Breathing and Patient connected to face mask oxygen  Post-op Assessment: Report given to RN and Post -op Vital signs reviewed and stable  Post vital signs: Reviewed  Last Vitals:  Vitals Value Taken Time  BP 122/68 10/28/22 0848  Temp 36.8 C 10/28/22 0845  Pulse 100 10/28/22 0856  Resp 22 10/28/22 0856  SpO2 94 % 10/28/22 0856  Vitals shown include unfiled device data.  Last Pain:  Vitals:   10/28/22 0624  TempSrc: Oral  PainSc: 5          Complications: No notable events documented.

## 2022-10-30 NOTE — Progress Notes (Signed)
10/30/22  Called patient to discuss results.  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  This extends to the cauterized edges of the mass.  Discussed with him that we should refer him to colorectal surgery for further management / surveillance.  He prefers to go to [facility] for this.  Will place referral.  Henrene Dodge, MD

## 2022-10-31 ENCOUNTER — Telehealth: Payer: Self-pay

## 2022-10-31 ENCOUNTER — Other Ambulatory Visit: Payer: Self-pay

## 2022-10-31 DIAGNOSIS — K620 Anal polyp: Secondary | ICD-10-CM

## 2022-10-31 NOTE — Telephone Encounter (Signed)
Faxed referral to Dr. Lind Covert at [facility] at [phone].

## 2022-11-12 ENCOUNTER — Encounter: Payer: Medicaid Other | Admitting: Surgery

## 2022-11-17 ENCOUNTER — Ambulatory Visit (INDEPENDENT_AMBULATORY_CARE_PROVIDER_SITE_OTHER): Payer: Medicaid Other | Admitting: Surgery

## 2022-11-17 ENCOUNTER — Encounter: Payer: Self-pay | Admitting: Surgery

## 2022-11-17 VITALS — BP 159/80 | HR 98 | Temp 98.5°F | Ht 72.0 in | Wt >= 6400 oz

## 2022-11-17 DIAGNOSIS — K620 Anal polyp: Secondary | ICD-10-CM | POA: Diagnosis not present

## 2022-11-17 DIAGNOSIS — Z09 Encounter for follow-up examination after completed treatment for conditions other than malignant neoplasm: Secondary | ICD-10-CM

## 2022-11-17 NOTE — Progress Notes (Signed)
11/17/2022  HPI: Joseph Waters is a 47 y.o. male s/p EUA and excision of anal polyp on 10/28/2022.  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  I called the patient with the results and placed a referral to colorectal surgery.  He has an appointment with [facility] on 02/02/2023.  Patient presents today for postoperative follow-up.  He reports that he has been doing very well from surgery denies any perianal pain or bleeding issues.  His bowel movements are better and feels definite improvement compared to preoperative.  Vital signs: BP (!) 159/80   Pulse 98   Temp 98.5 F (36.9 C)   Ht 6' (1.829 m)   Wt (!) 424 lb (192.3 kg)   SpO2 97%   BMI 57.50 kg/m    Physical Exam: Constitutional: No acute distress Rectal: External exam reveals stable enlarged external hemorrhoids which are soft and nontender to palpation.  These have not been causing the patient any issues except for some intermittent itching.  On digital rectal exam, the area of excision is palpable resulting in some scar tissue but otherwise no new masses palpable.  Assessment/Plan: This is a 47 y.o. male s/p exam under anesthesia and excision of anal polyp.  - Discussed with the patient again the findings on pathology and the rationale for referral to colorectal surgery for further evaluation and follow-up.  Patient is in agreement with this and he has an appointment with [facility] colorectal surgery on 02/02/2023. - Postoperatively, the patient is doing well without any issues with constipation or bleeding or pain.  Discussed with him that he can continue using soft wipes for cleansing and potentially doing sitz bath versus showers to help clean the external area to prevent any itching. - Follow-up as needed.   [name], MD [facility]

## 2022-11-17 NOTE — Patient Instructions (Signed)
Follow up with [facility] as scheduled.    Follow-up with our office as needed.  Please call and ask to speak with a nurse if you develop questions or concerns.

## 2023-06-23 IMAGING — CT CT HEAD W/O CM
4 series · 16 of 47 positions shown, 18 images · non-contrast
Comparison: No priors.

CLINICAL DATA: 45-year-old male with history of left-sided headache
and face pressure.



[Series 2: head wo · axial · 0.50mm/px · z∈[-95,+25]mm · 7 of 34 slices shown, 9 images]
[im 5/34  brain]
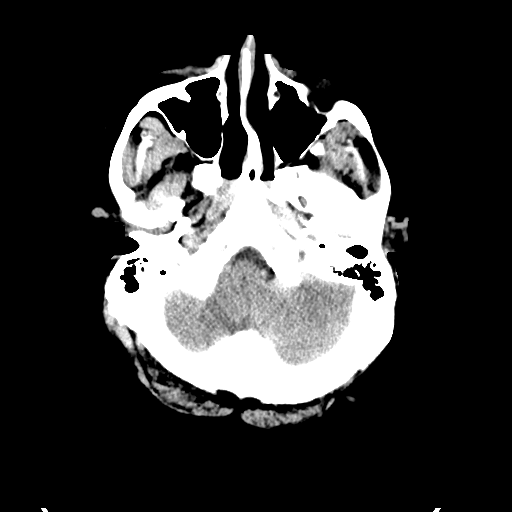
[im 5/34  bone]
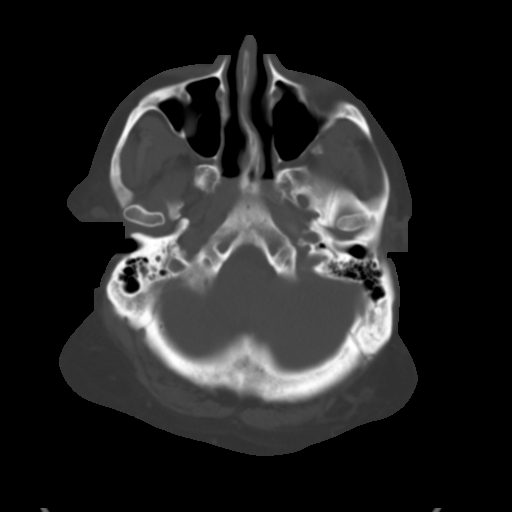
[im 9/34  brain]
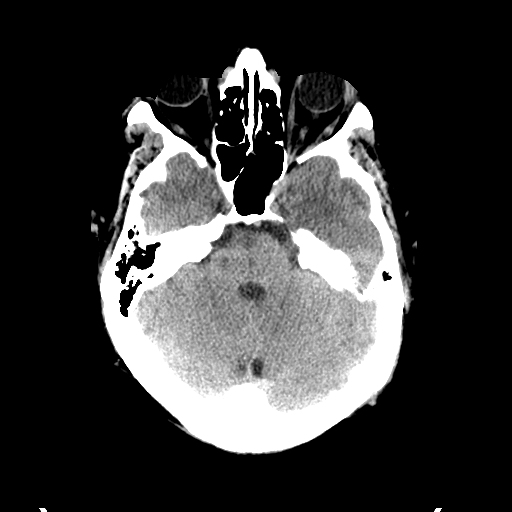
[im 13/34  brain]
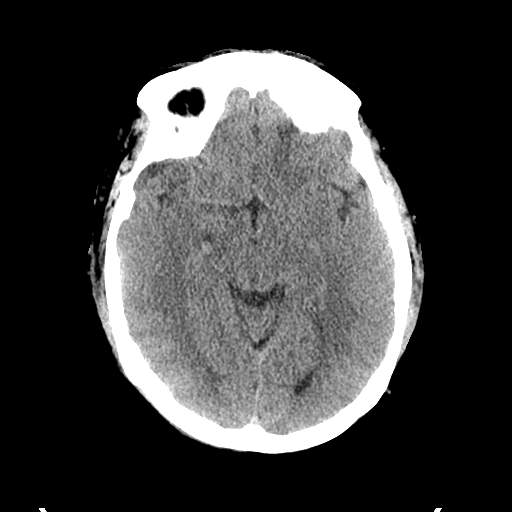
[im 17/34  brain]
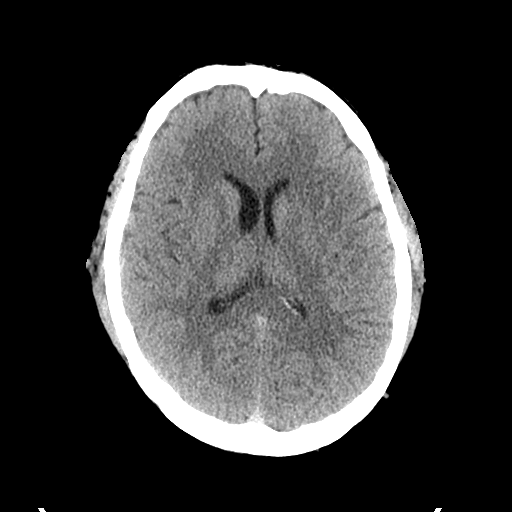
[im 21/34  brain]
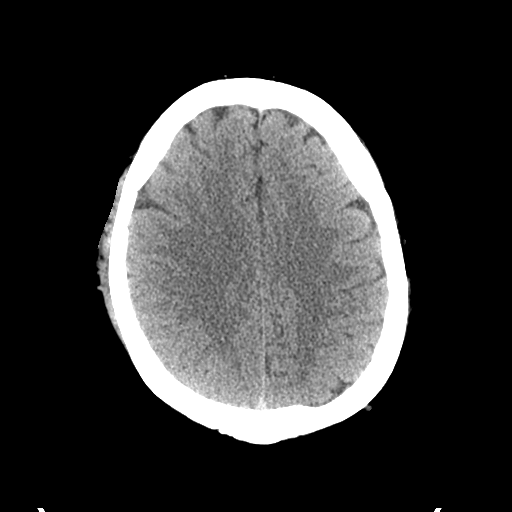
[im 21/34  bone]
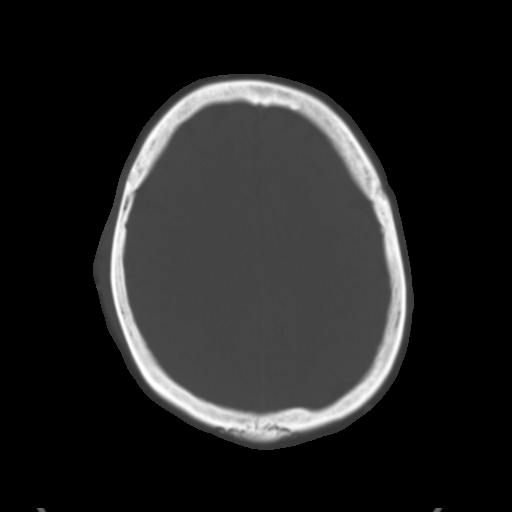
[im 25/34  brain]
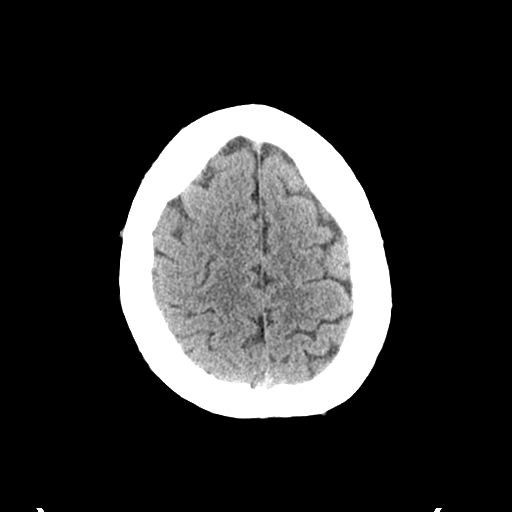
[im 29/34  brain]
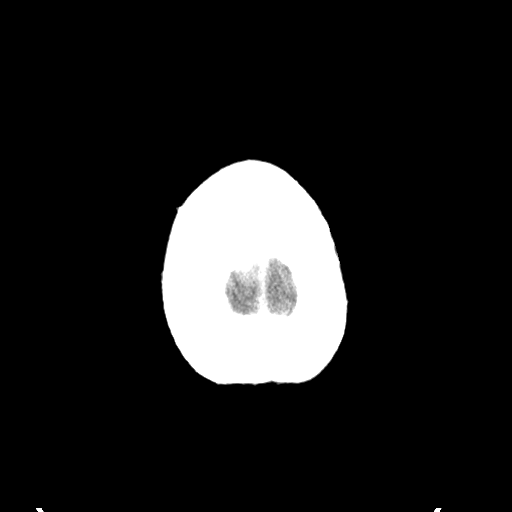

[Series 3: head bone · axial · 0.50mm/px · z∈[-99,-65]mm · 3 of 85 slices shown]
[im 9/85  bone]
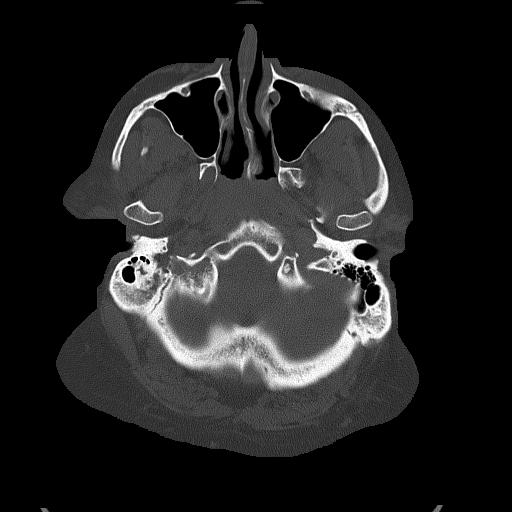
[im 17/85  bone]
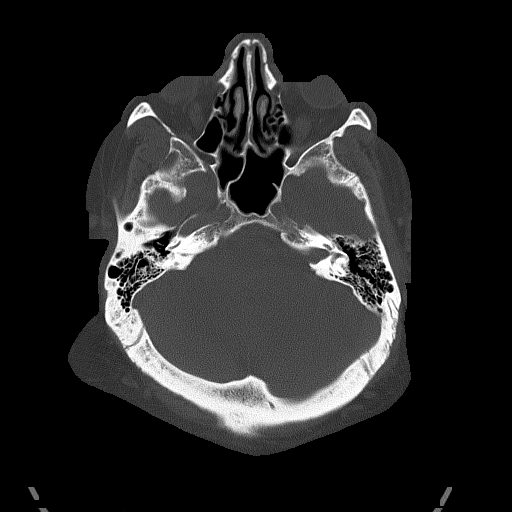
[im 26/85  bone]
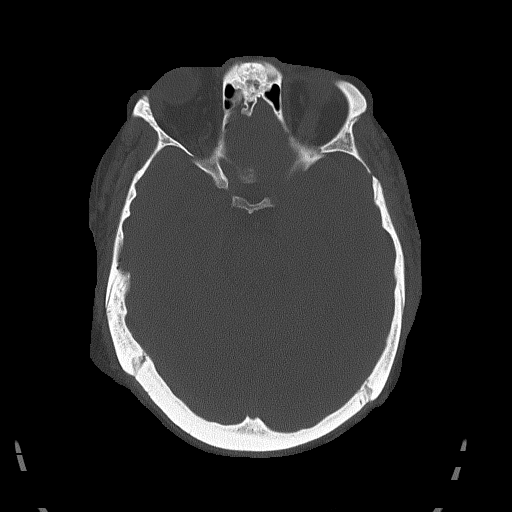

[Series 4: coronal soft tissue · coronal · 0.34mm/px · 3 of 75 slices shown]
[im 25/75  brain]
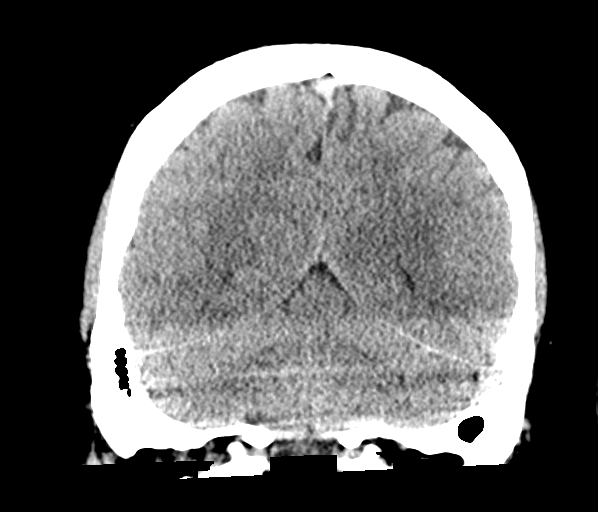
[im 33/75  brain]
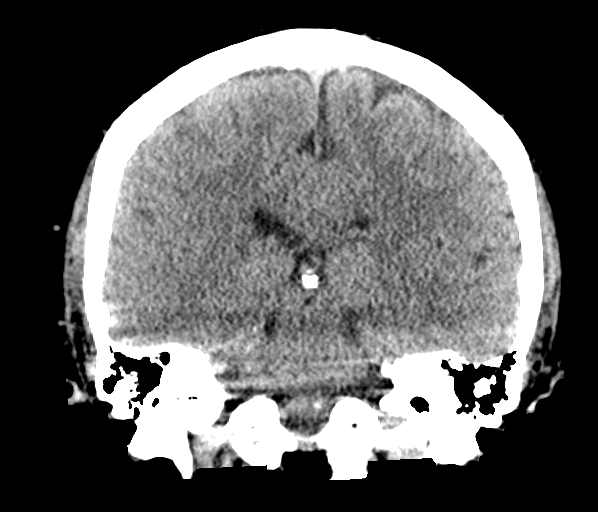
[im 42/75  brain]
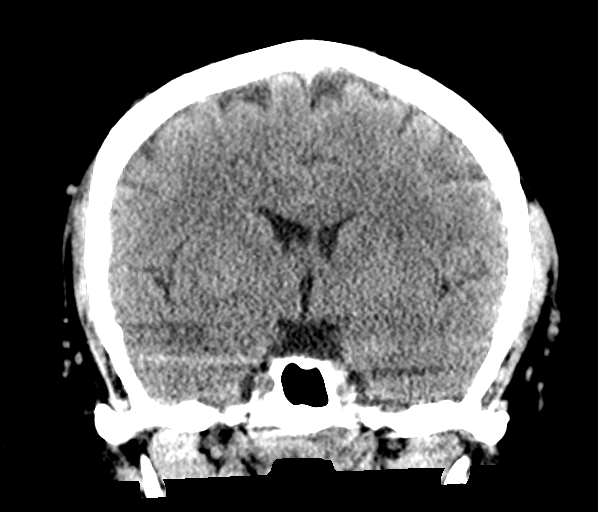

[Series 5: sagittal soft tissue · sagittal · 0.34mm/px · 3 of 68 slices shown]
[im 23/68  brain]
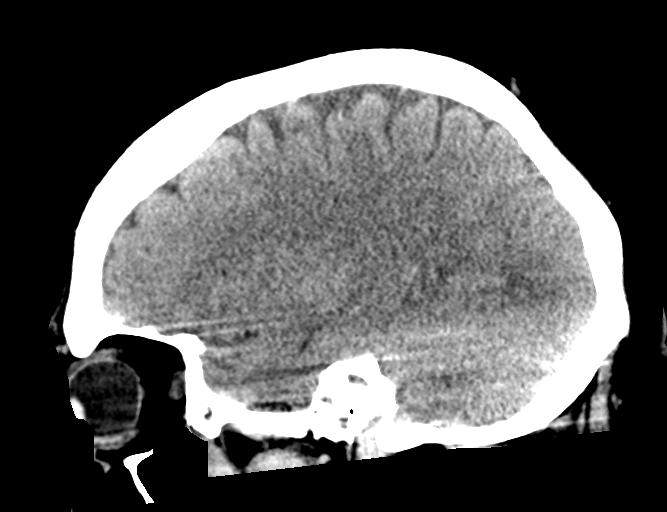
[im 34/68  brain]
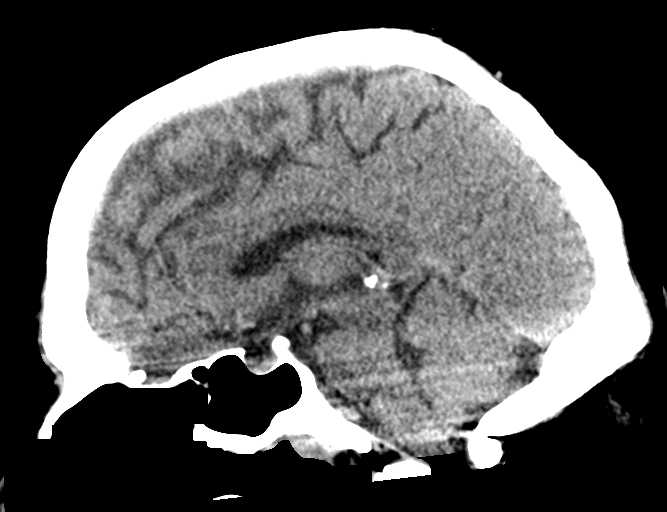
[im 45/68  brain]
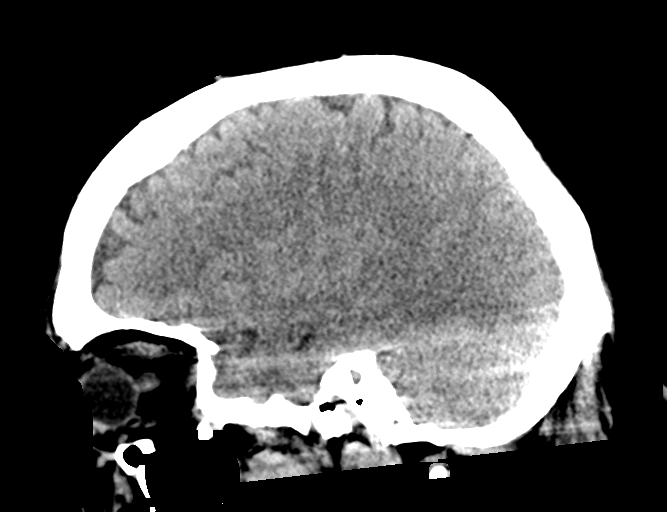

[16 of 47 positions shown; findings below may reference images not displayed]

FINDINGS: Brain: No evidence of acute infarction, hemorrhage, hydrocephalus,
extra-axial collection or mass lesion/mass effect.

Vascular: No hyperdense vessel or unexpected calcification.

Skull: Normal. Negative for fracture or focal lesion.

Sinuses/Orbits: No acute finding.

Other: None.
IMPRESSION: 1. No acute intracranial abnormalities. The appearance of the brain
is normal.

## 2023-10-07 IMAGING — CR DG CHEST 2V
1 series · 3 of 3 positions shown · non-contrast
Comparison: Chest x-ray dated July 22, 2021

CLINICAL DATA: Shortness of breath

EXAM:
CHEST - 2 VIEW

[Series 1: dg chest 2 view · 0.14mm/px · 3 of 3 slices shown]
[im 1/3]
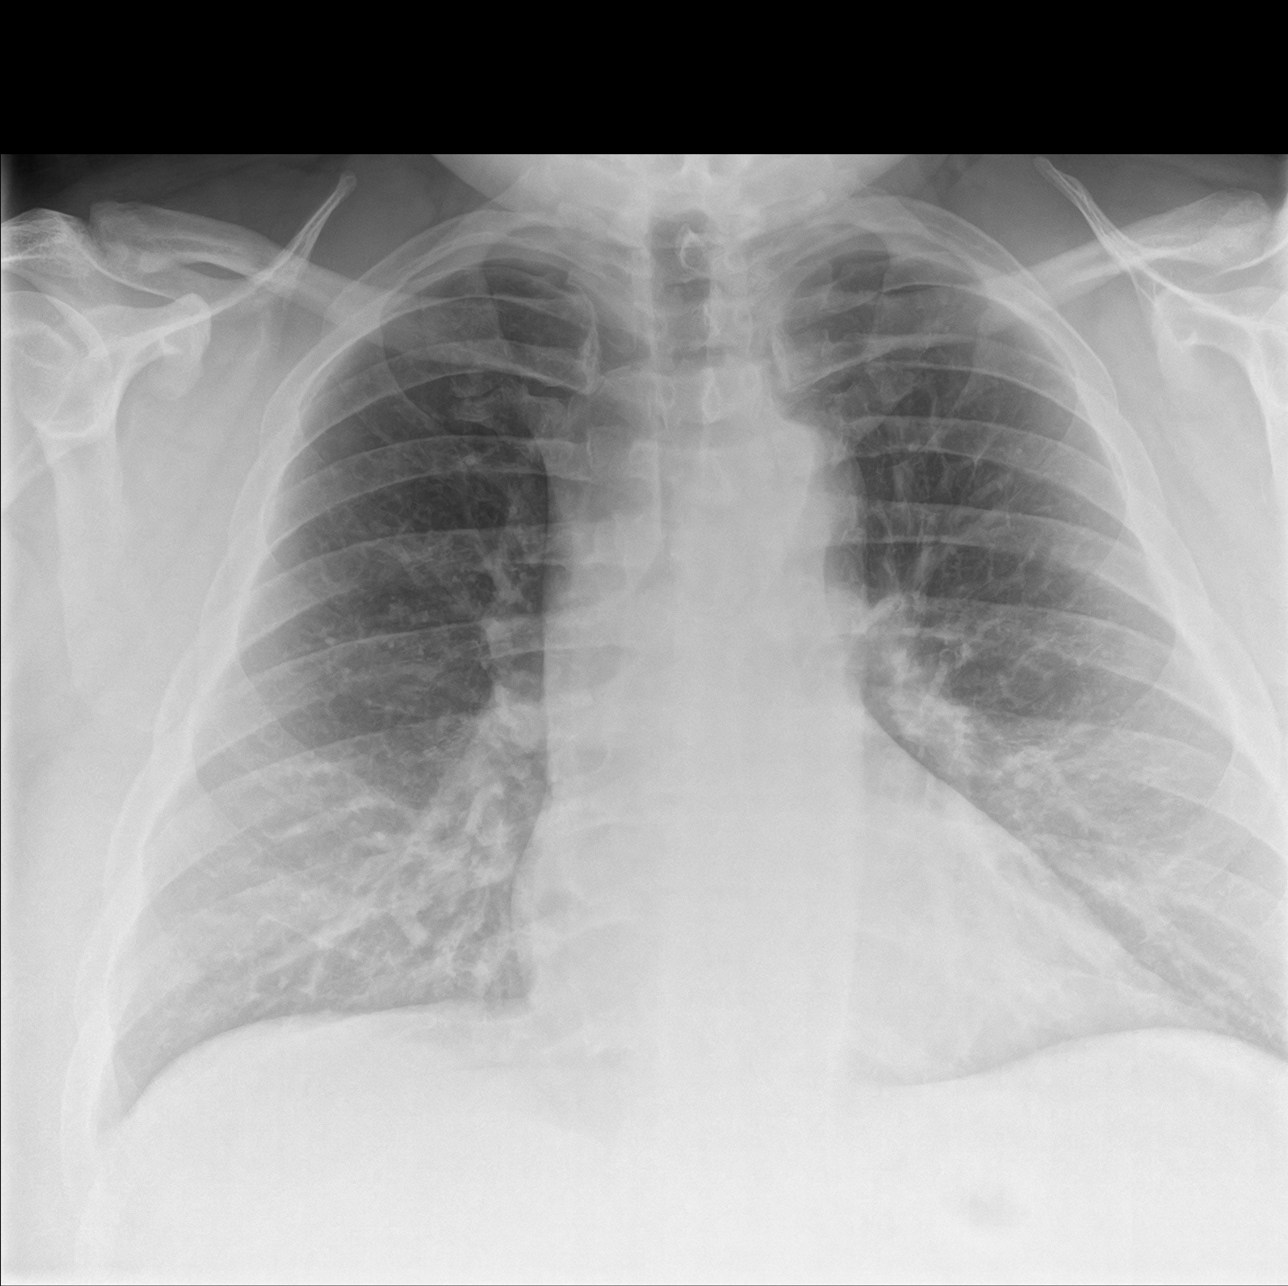
[im 2/3]
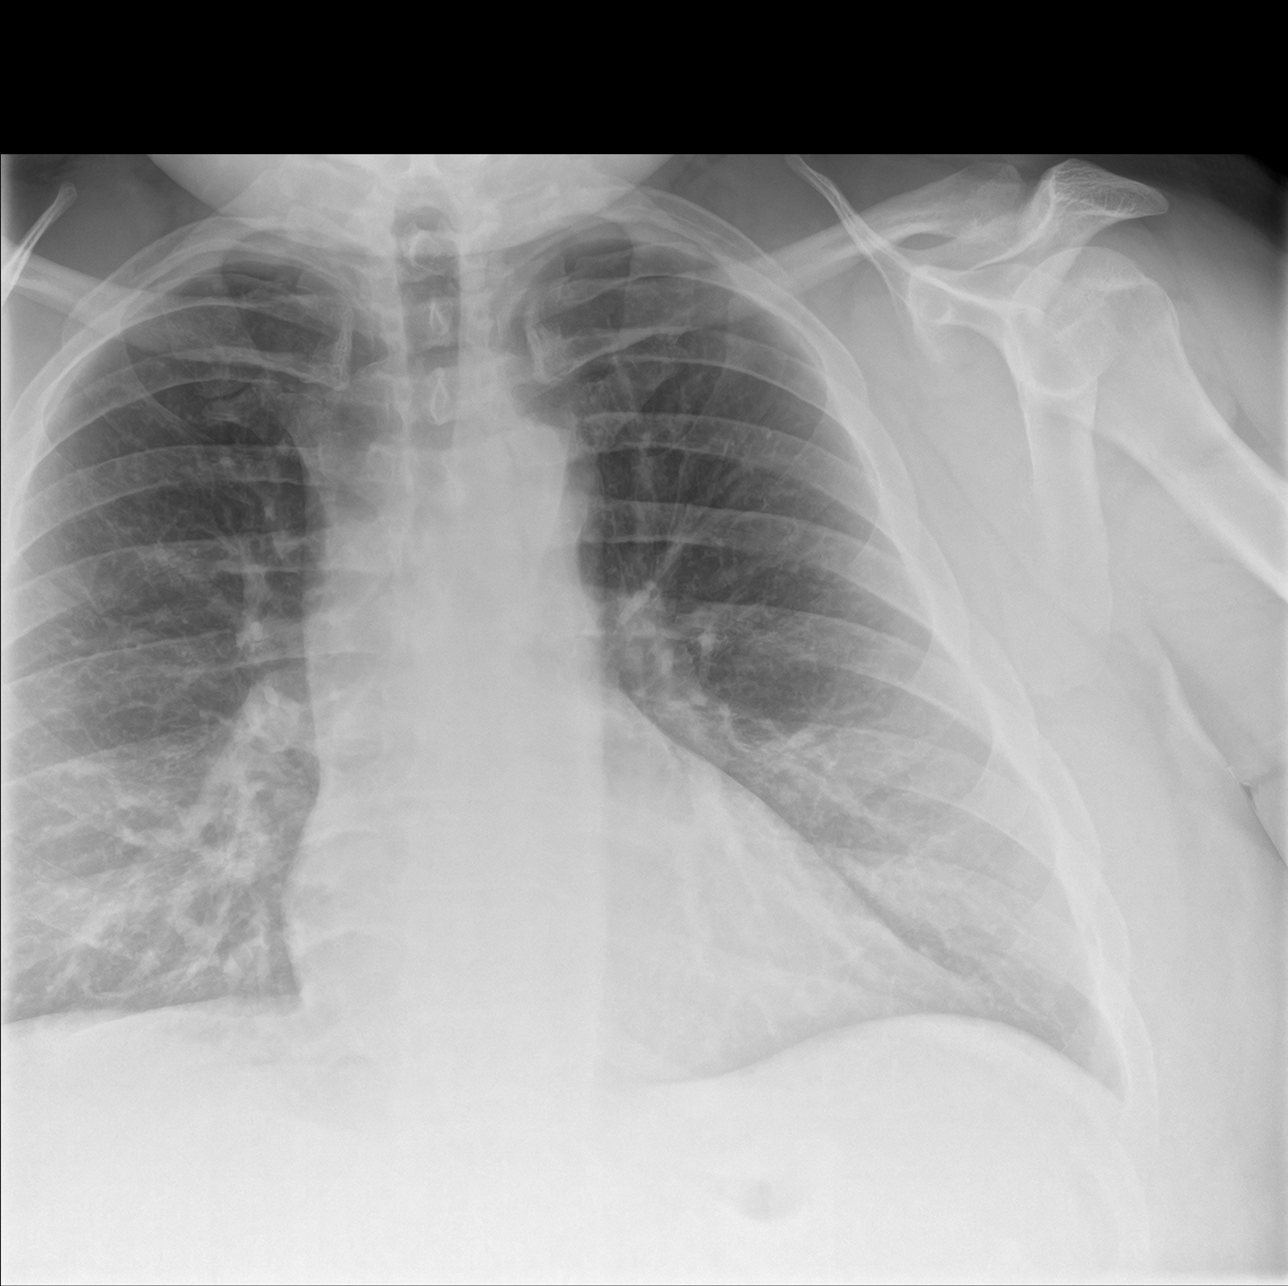
[im 3/3]
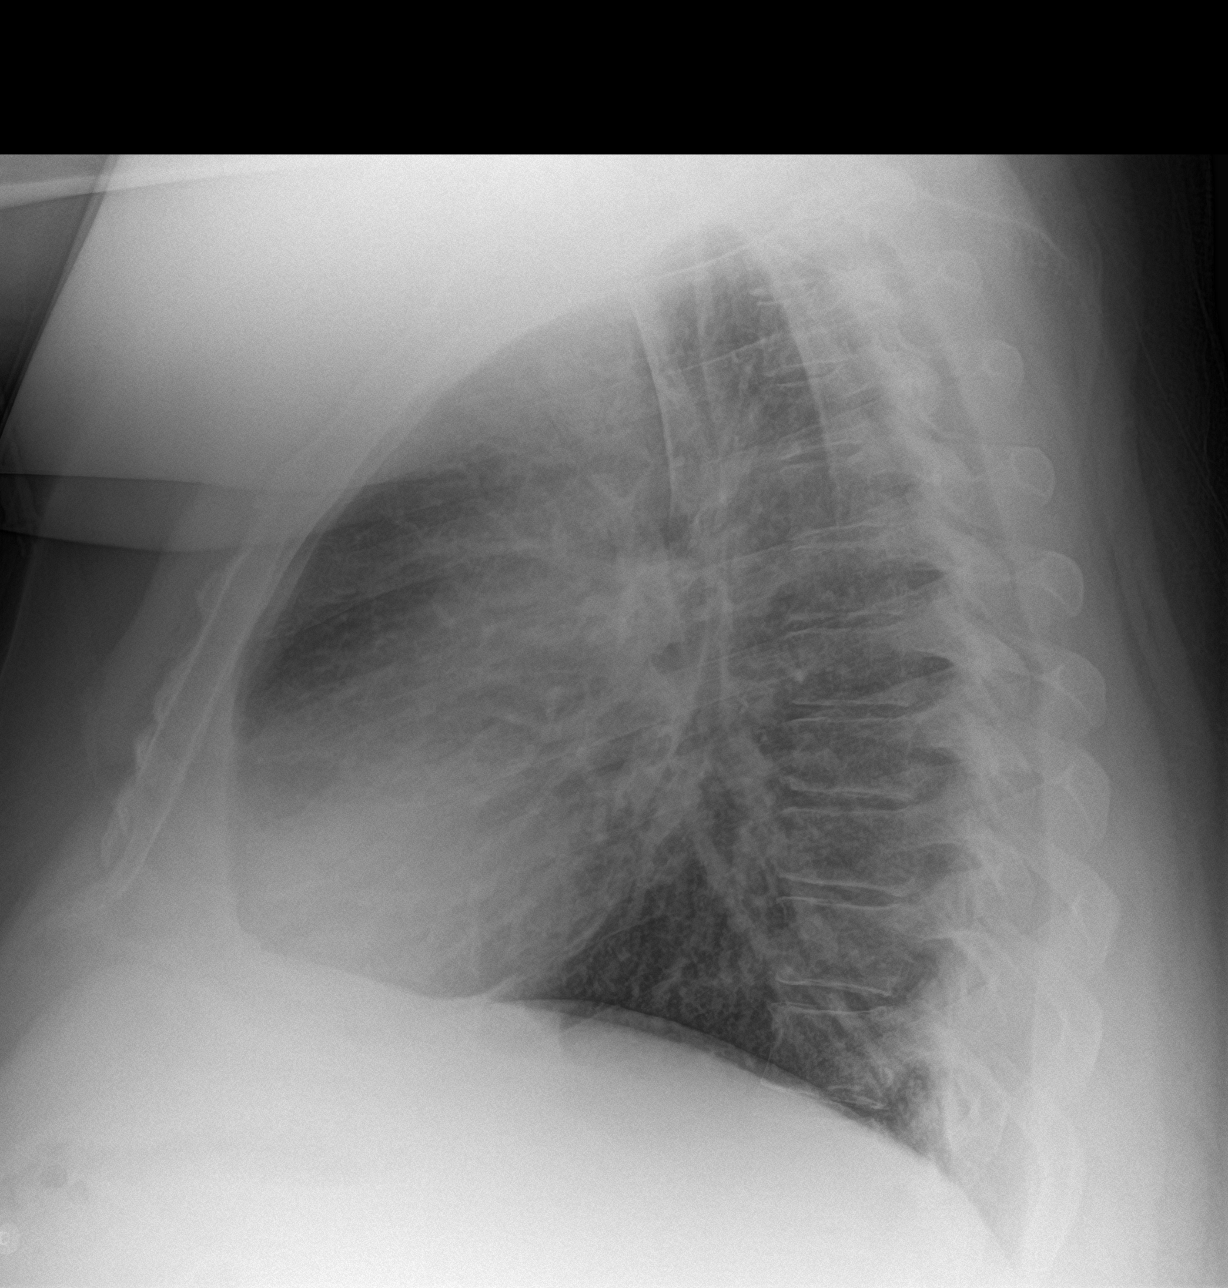

[3 of 3 positions shown; findings below may reference images not displayed]

FINDINGS: The heart size and mediastinal contours are within normal limits.
Both lungs are clear. The visualized skeletal structures are
unremarkable.
IMPRESSION: No active cardiopulmonary disease.

## 2023-12-21 ENCOUNTER — Encounter: Payer: Self-pay | Admitting: Surgery

## 2023-12-21 ENCOUNTER — Ambulatory Visit (INDEPENDENT_AMBULATORY_CARE_PROVIDER_SITE_OTHER): Admitting: Surgery

## 2023-12-21 VITALS — BP 147/89 | HR 80 | Ht 72.0 in | Wt 392.0 lb

## 2023-12-21 DIAGNOSIS — K644 Residual hemorrhoidal skin tags: Secondary | ICD-10-CM

## 2023-12-21 DIAGNOSIS — D126 Benign neoplasm of colon, unspecified: Secondary | ICD-10-CM

## 2023-12-21 DIAGNOSIS — K620 Anal polyp: Secondary | ICD-10-CM

## 2023-12-21 MED ORDER — HYDROCORTISONE ACETATE 30 MG RE SUPP: 1.0000 | Freq: Two times a day (BID) | RECTAL | 0 refills | Status: AC

## 2023-12-21 MED ORDER — AMOXICILLIN-POT CLAVULANATE 875-125 MG PO TABS: 1.0000 | ORAL_TABLET | Freq: Two times a day (BID) | ORAL | 0 refills | Status: AC

## 2023-12-21 NOTE — Progress Notes (Signed)
 12/21/2023  History of Present Illness: Joseph Waters is a 48 y.o. male presenting for evaluation of perianal pain.  The patient is status post exam under anesthesia with excision of posterior anal polyp on 10/28/2022.  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  He was referred to colorectal surgery at [facility] but the patient did not go to his appointment due to a death in the family.  The patient forgot to then call [facility] back to reschedule.  The patient reports that over the last two months he has been experiencing perianal pain externally that mostly is happening at night.  He reports his bowel movements have been soft and denies any constipation or hard stool.  Denies any pain with the actual bowel movements.  Reports that the pain is sometimes worse when she is sitting down but sometimes does not necessarily an issue.  He has tried over-the-counter medications but this has not improved.  He does feel the external hemorrhoids and reports that the pain is in the posterior lateral area feels like it is more outside.  Past Medical History: Past Medical History:  Diagnosis Date   Edema    ble   Gout    Hypertension    Morbid obesity with BMI of 50.0-59.9, adult (HCC)    Sleep apnea    Stroke [facility])      Past Surgical History: Past Surgical History:  Procedure Laterality Date   CHOLECYSTECTOMY     COLONOSCOPY WITH PROPOFOL  N/A 10/07/2022   Procedure: COLONOSCOPY WITH PROPOFOL ;  Surgeon: Therisa Bi, MD;  Location: [facility] ENDOSCOPY;  Service: Gastroenterology;  Laterality: N/A;   HERNIA REPAIR     POLYPECTOMY  10/07/2022   Procedure: POLYPECTOMY;  Surgeon: Therisa Bi, MD;  Location: [facility] ENDOSCOPY;  Service: Gastroenterology;;   TUMOR EXCISION N/A 10/28/2022   Procedure: TUMOR EXCISION RECTAL, polyp;  Surgeon: Desiderio Schanz, MD;  Location: [facility] ORS;  Service: General;  Laterality: N/A;    Home Medications: Prior to Admission medications   Medication Sig Start Date End Date Taking?  Authorizing Provider  acetaminophen  (TYLENOL ) 500 MG tablet Take 2 tablets (1,000 mg total) by mouth every 6 (six) hours as needed for mild pain. 10/28/22  Yes Bridger Pizzi, Schanz, MD  allopurinol (ZYLOPRIM) 100 MG tablet Take 100 mg by mouth daily. 12/09/23  Yes [provider]  amLODipine  (NORVASC ) 5 MG tablet Take 5 mg by mouth daily.   Yes [provider]  amoxicillin -clavulanate (AUGMENTIN ) 875-125 MG tablet Take 1 tablet by mouth 2 (two) times daily for 7 days. 12/21/23 12/28/23 Yes Ellanor Feuerstein, Schanz, MD  colchicine  0.6 MG tablet Take 0.6 mg by mouth daily as needed.   Yes [provider]  HYDROCORTISONE ACE, RECTAL, 30 MG SUPP Place 1 suppository (30 mg total) rectally in the morning and at bedtime. 12/21/23  Yes Bertie Simien, Schanz, MD  ibuprofen  (ADVIL ) 600 MG tablet Take 1 tablet (600 mg total) by mouth every 8 (eight) hours as needed for moderate pain. 10/28/22  Yes Axton Cihlar, Schanz, MD  losartan (COZAAR) 100 MG tablet Take 100 mg by mouth daily. 12/09/23  Yes [provider]  OZEMPIC, 2 MG/DOSE, 8 MG/3ML SOPN Inject 2 mg into the skin once a week. 12/09/23  Yes [provider]  pravastatin (PRAVACHOL) 40 MG tablet Take 40 mg by mouth at bedtime. For 30 days. 08/24/18 09/24/19  [provider]    Allergies: No Known Allergies  Review of Systems: Review of Systems  Constitutional:  Negative for fever.  Respiratory:  Negative for shortness of breath.   Cardiovascular:  Negative for chest pain.  Gastrointestinal:  Negative for constipation, nausea and vomiting.       Perianal pain    Physical Exam BP (!) 147/89   Pulse 80   Ht 6' (1.829 m)   Wt (!) 392 lb (177.8 kg)   SpO2 97%   BMI 53.16 kg/m  CONSTITUTIONAL: No acute distress HEENT:  Normocephalic, atraumatic, extraocular motion intact. RESPIRATORY:  Normal respiratory effort without pathologic use of accessory muscles. CARDIOVASCULAR: Regular rhythm and rate. RECTAL: External exam reveals  again enlarged external hemorrhoids without significant inflammatory changes although there is some firmness on palpation particularly posteriorly.  No abscesses noted.  On digital rectal exam, the patient has more tenderness posteriorly as well.  No gross blood noted.  There may potentially be a small tear at the anal verge but is difficult to see due to body habitus. NEUROLOGIC:  Motor and sensation is grossly normal.  Cranial nerves are grossly intact. PSYCH:  Alert and oriented to person, place and time. Affect is normal.   Assessment and Plan: [name] with perianal pain.  - Discussed with patient that potential reason for the perianal pain could be that he is having issues with external hemorrhoids.  Over-the-counter medications may have helped to some degree but discussed with him that we could treat this with Anusol suppositories to help decrease the inflammation in the area and improve his pain.  Discussed with him that potentially this could be a fissure but is very difficult to examine due to body habitus.  I do not have anoscopy in our office in order to visualize the anal canal or anal verge better.  Discussed with him that if the suppositories did not help, then we would potentially be try nifedipine for fissure management.  Unfortunate discussed with patient that given the findings on his pathology last year, this could potentially be related to this.  He never followed up with [facility] after missing his appointment.  We will send a new referral for colorectal surgery so he can be seen. - Follow-up with me in 2 weeks to check on his progress.  I spent 20 minutes dedicated to the care of this patient on the date of this encounter to include pre-visit review of records, face-to-face time with the patient discussing diagnosis and management, and any post-visit coordination of care.   Aloysius Sheree Plant, MD [facility]

## 2023-12-21 NOTE — Patient Instructions (Addendum)
 We will send in a prescription for antibiotics to treat the area on your buttocks.   We will also send in a steroid suppository to help decrease your inflammation of the hemorrhoids.    We will send a new referral to [facility] for them to see you. [phone] this is their number.   Follow-up with our office on October 31st.   Please call and ask to speak with a nurse if you develop questions or concerns.

## 2024-01-01 ENCOUNTER — Encounter: Payer: Self-pay | Admitting: Surgery

## 2024-01-01 ENCOUNTER — Ambulatory Visit: Admitting: Surgery

## 2024-01-01 VITALS — BP 138/75 | HR 91 | Ht 72.0 in | Wt 381.0 lb

## 2024-01-01 DIAGNOSIS — K620 Anal polyp: Secondary | ICD-10-CM

## 2024-01-01 DIAGNOSIS — K644 Residual hemorrhoidal skin tags: Secondary | ICD-10-CM

## 2024-01-01 DIAGNOSIS — A630 Anogenital (venereal) warts: Secondary | ICD-10-CM

## 2024-01-01 DIAGNOSIS — K6289 Other specified diseases of anus and rectum: Secondary | ICD-10-CM

## 2024-01-01 NOTE — Patient Instructions (Signed)
 Your prescription for Nifedipine ointment has been called into [facility] in [place]. This is a compounded medication and they are the only pharmacy that does this. Insurance does not normally cover this medication and the cost is $56. Apply a small amount to your finger and place this just inside the rectum three times a day for 6 weeks.  The drug store will call you to let you know when this prescription is ready. [facility] is located at [address], [place], [place] [address] [facility]: [phone]  We will have your follow up here in 1 month for a recheck.

## 2024-01-04 ENCOUNTER — Encounter: Payer: Self-pay | Admitting: Surgery

## 2024-01-04 NOTE — Progress Notes (Signed)
 01/01/2024  History of Present Illness: Joseph Waters is a 48 y.o. male s/p EUA and excision of anal polyp on 10/28/22.  Patient was last seen on 12/21/23 due to perianal pain.  It was unclear if this pain was related to hemorrhoids or a possible anal fissure.  Unfortunately due to body habitus it was difficult to assess for a fissure.  He was empirically started on Anusol.  He reports that he was able to use the suppositories for about 4-5 days, but then was having a lot of burning sensation with it.  Reports his symptoms are overall the same with the pain at night and when sitting.    Past Medical History: Past Medical History:  Diagnosis Date   Edema    ble   Gout    Hypertension    Morbid obesity with BMI of 50.0-59.9, adult (HCC)    Sleep apnea    Stroke [facility])      Past Surgical History: Past Surgical History:  Procedure Laterality Date   CHOLECYSTECTOMY     COLONOSCOPY WITH PROPOFOL  N/A 10/07/2022   Procedure: COLONOSCOPY WITH PROPOFOL ;  Surgeon: Therisa Bi, MD;  Location: [facility] ENDOSCOPY;  Service: Gastroenterology;  Laterality: N/A;   HERNIA REPAIR     POLYPECTOMY  10/07/2022   Procedure: POLYPECTOMY;  Surgeon: Therisa Bi, MD;  Location: [facility] ENDOSCOPY;  Service: Gastroenterology;;   TUMOR EXCISION N/A 10/28/2022   Procedure: TUMOR EXCISION RECTAL, polyp;  Surgeon: Desiderio Schanz, MD;  Location: [facility] ORS;  Service: General;  Laterality: N/A;    Home Medications: Prior to Admission medications   Medication Sig Start Date End Date Taking? Authorizing Provider  acetaminophen  (TYLENOL ) 500 MG tablet Take 2 tablets (1,000 mg total) by mouth every 6 (six) hours as needed for mild pain. 10/28/22  Yes Saurav Crumble, Schanz, MD  allopurinol (ZYLOPRIM) 100 MG tablet Take 100 mg by mouth daily. 12/09/23  Yes [provider]  amLODipine  (NORVASC ) 5 MG tablet Take 5 mg by mouth daily.   Yes [provider]  colchicine  0.6 MG tablet Take 0.6 mg by mouth daily as needed.   Yes  [provider]  HYDROCORTISONE ACE, RECTAL, 30 MG SUPP Place 1 suppository (30 mg total) rectally in the morning and at bedtime. 12/21/23  Yes Kapena Hamme, Schanz, MD  ibuprofen  (ADVIL ) 600 MG tablet Take 1 tablet (600 mg total) by mouth every 8 (eight) hours as needed for moderate pain. 10/28/22  Yes Renaye Janicki, Schanz, MD  losartan (COZAAR) 100 MG tablet Take 100 mg by mouth daily. 12/09/23  Yes [provider]  OZEMPIC, 2 MG/DOSE, 8 MG/3ML SOPN Inject 2 mg into the skin once a week. 12/09/23  Yes [provider]  pravastatin (PRAVACHOL) 40 MG tablet Take 40 mg by mouth at bedtime. For 30 days. 08/24/18 09/24/19  [provider]    Allergies: No Known Allergies  Review of Systems: Review of Systems  Constitutional:  Negative for chills and fever.  Respiratory:  Negative for shortness of breath.   Cardiovascular:  Negative for chest pain.  Gastrointestinal:        Perianal pain    Physical Exam BP 138/75   Pulse 91   Ht 6' (1.829 m)   Wt (!) 381 lb (172.8 kg)   SpO2 98%   BMI 51.67 kg/m  CONSTITUTIONAL: No acute distress HEENT:  Normocephalic, atraumatic, extraocular motion intact. RESPIRATORY:  Normal respiratory effort without pathologic use of accessory muscles. CARDIOVASCULAR: Regular rhythm and rate. RECTAL:  External exam reveals  stable enlarged external hemorrhoids, without any significant inflammatory changes.  Digital rectal exam has tenderness posteriorly, but again I'm unable to visualize a fissure.  There are no palpable masses.  I attempted to insert a disposable, transparent anoscope but the patient had too much pain during insertion. NEUROLOGIC:  Motor and sensation is grossly normal.  Cranial nerves are grossly intact. PSYCH:  Alert and oriented to person, place and time. Affect is normal.   Assessment and Plan: [name] with perianal pain.  --The patient tried using the Anusol suppositories but was unable to continue after a  few days due to burning sensation.  On exam today, the sphincter tone is somewhat tighter and I was unable to insert an anoscope due to pain.  At least I do not palpate any masses on exam.   --Although not able to visualize a fissure, the sphincter tone and pain with insertion of anoscope could reflect a fissure.  Discussed with the patient that we would start treatment for a presumed fissure using Nifedipine compound.   --Patient tried calling [facility] colorectal for an appointment but apparently was calling a fax number.  Have confirmed number with him today and he will call to get an appointment for follow up of anal condyloma with LSIL/AIN1. --Follow up with me in 1 month to reassess his progress with the Nifedipine.  If there is no improvement with this, then we'd have to do repeat EUA to further evaluate.  I spent 20 minutes dedicated to the care of this patient on the date of this encounter to include pre-visit review of records, face-to-face time with the patient discussing diagnosis and management, and any post-visit coordination of care.   Aloysius Sheree Plant, MD [facility]

## 2024-01-27 ENCOUNTER — Ambulatory Visit: Admitting: Surgery

## 2024-01-27 ENCOUNTER — Encounter: Payer: Self-pay | Admitting: Surgery

## 2024-01-27 VITALS — BP 156/90 | HR 88 | Ht 72.0 in | Wt 383.0 lb

## 2024-01-27 DIAGNOSIS — K6289 Other specified diseases of anus and rectum: Secondary | ICD-10-CM | POA: Diagnosis not present

## 2024-01-27 NOTE — Progress Notes (Signed)
 01/27/2024  History of Present Illness: Joseph Waters is a 48 y.o. male s/p EUA and excision of anal polyp on 10/28/22.  Pathology showed an anal condyloma with LSIL/AIN1.  He was referred to colorectal surgery at [facility] but so far he has not had an appointment yet.  He initially had an appointment but had to miss it due to other issues, and he reports he has called the number we gave him and left voicemails but has not had a callback.    He was seen again last month due to concerns for perianal pain.  Exam is difficult due to body habitus but he has external and internal hemorrhoids.  He was initially treated with Anusol  in case the pain was from hemorrhoids, but this did not help.  He was also treated empirically for a possible anal fissure, but today he reports that the Nifedipine compound medication has not been helping either.  Patient overall reports intermittent episodes of perianal pain, that seem to be in the posterior region.  He reports the pain happens randomly and mostly when he's sitting or driving.  He also reports intermittent blood in his stool.  Denies any pain specifically during a bowel movement and reports his stool is soft and bowels are regular.  Past Medical History: Past Medical History:  Diagnosis Date   Edema    ble   Gout    Hypertension    Morbid obesity with BMI of 50.0-59.9, adult (HCC)    Sleep apnea    Stroke [facility])      Past Surgical History: Past Surgical History:  Procedure Laterality Date   CHOLECYSTECTOMY     COLONOSCOPY WITH PROPOFOL  N/A 10/07/2022   Procedure: COLONOSCOPY WITH PROPOFOL ;  Surgeon: Therisa Bi, MD;  Location: [facility] ENDOSCOPY;  Service: Gastroenterology;  Laterality: N/A;   HERNIA REPAIR     POLYPECTOMY  10/07/2022   Procedure: POLYPECTOMY;  Surgeon: Therisa Bi, MD;  Location: [facility] ENDOSCOPY;  Service: Gastroenterology;;   TUMOR EXCISION N/A 10/28/2022   Procedure: TUMOR EXCISION RECTAL, polyp;  Surgeon: Desiderio Schanz, MD;  Location: [facility]  ORS;  Service: General;  Laterality: N/A;    Home Medications: Prior to Admission medications   Medication Sig Start Date End Date Taking? Authorizing Provider  acetaminophen  (TYLENOL ) 500 MG tablet Take 2 tablets (1,000 mg total) by mouth every 6 (six) hours as needed for mild pain. 10/28/22   Desiderio Schanz, MD  allopurinol (ZYLOPRIM) 100 MG tablet Take 100 mg by mouth daily. 12/09/23   [provider]  amLODipine  (NORVASC ) 5 MG tablet Take 5 mg by mouth daily.    [provider]  colchicine  0.6 MG tablet Take 0.6 mg by mouth daily as needed.    [provider]  HYDROCORTISONE  ACE, RECTAL, 30 MG SUPP Place 1 suppository (30 mg total) rectally in the morning and at bedtime. 12/21/23   Desiderio Schanz, MD  ibuprofen  (ADVIL ) 600 MG tablet Take 1 tablet (600 mg total) by mouth every 8 (eight) hours as needed for moderate pain. 10/28/22   Danaye Sobh, MD  losartan (COZAAR) 100 MG tablet Take 100 mg by mouth daily. 12/09/23   [provider]  OZEMPIC, 2 MG/DOSE, 8 MG/3ML SOPN Inject 2 mg into the skin once a week. 12/09/23   [provider]  pravastatin (PRAVACHOL) 40 MG tablet Take 40 mg by mouth at bedtime. For 30 days. 08/24/18 09/24/19  [provider]    Allergies: No Known Allergies  Review of Systems: Review of  Systems  Constitutional:  Negative for chills and fever.  Respiratory:  Negative for shortness of breath.   Cardiovascular:  Negative for chest pain.  Gastrointestinal:        Perianal pain     Physical Exam BP (!) 156/90   Pulse 88   Ht 6' (1.829 m)   Wt (!) 383 lb (173.7 kg)   SpO2 98%   BMI 51.94 kg/m  CONSTITUTIONAL: No acute distress HEENT:  Normocephalic, atraumatic, extraocular motion intact. RESPIRATORY:  Normal respiratory effort without pathologic use of accessory muscles. RECTAL:  External exam shows again stable enlarged external hemorrhoids, but without any evidence of inflammation.  No gross evidence of a  fistula either.  Currently no pain externally.  DRE deferred today per patient preference. NEUROLOGIC:  Motor and sensation is grossly normal.  Cranial nerves are grossly intact. PSYCH:  Alert and oriented to person, place and time. Affect is normal.   Assessment and Plan: [name] with perianal pain.  --Discussed with the patient the rationale for trying to treat empirically for hemorrhoids at first and then for anal fissure.  However, neither of these treatments have helped improve his symptoms.  His symptoms are not as typical for either.  The external hemorrhoids do not appear to be inflamed to cause pain, and he does not have pain with bowel movement to suggest a fissure.  However, he has intermittent pain and also intermittent blood in his stool.  He does have diagnosis of LSIL/AIN1 from last  year, so if there is any new growth, this could possibly cause pain, but uncertain.   --The patient had an appointment with [facility] colorectal surgery last year but missed it due to other reasons.  He has tried calling but the number he was using would go to a voicemail and he has not been called back per the patient.  We have given him two other numbers to call which we have verified to work so he can call and set up an appointment.  The referral to [facility] is still active.  Hopefully they can assess him and evaluate for this source of pain. --Follow up as needed.  I spent 10 minutes dedicated to the care of this patient on the date of this encounter to include pre-visit review of records, face-to-face time with the patient discussing diagnosis and management, and any post-visit coordination of care.   Aloysius Sheree Plant, MD [facility]

## 2024-01-27 NOTE — Patient Instructions (Addendum)
 We referred you to Dr Fenton at [facility], you may call them to make an appointment at [phone] or [phone]  Anal Fissure, Adult  An anal fissure is a small tear or crack in the tissue near the opening of the butt (anus). In most cases, bleeding from the tear or crack stops on its own within a few minutes. You may have bleeding each time you poop until the tear or crack heals. What are the causes? Passing a large or hard poop (stool). Having trouble pooping (constipation). Having watery poops (diarrhea). An inflammatory bowel disease, like Crohn's disease or ulcerative colitis. Childbirth. Infections. Anal sex. What are the signs or symptoms? Bleeding from the butt. Small amounts of blood on your poop. The blood coats the outside of the poop. It is not mixed with the poop. Small amounts of blood on the toilet paper or in the toilet after you poop. Pain when you poop. Itching or irritation around your butt. How is this treated? Treatment may include: Making changes to what you eat and drink. This can help if you have trouble pooping. Taking fiber supplements. These can help make your poop soft. Taking warm water  baths (sitz baths). These can help heal the tear. Using creams and ointments. If other treatments do not work, you may need: A shot near the tear or crack (botulinum injection). Surgery to fix the tear or crack. Follow these instructions at home: Medicines Take over-the-counter and prescription medicines only as told by your doctor. This includes creams and ointments that have medicine in them. Use medicines to make your poop soft as told by your doctor. Treating constipation You may need to take these actions to prevent or treat trouble pooping: Drink enough fluid to keep your pee (urine) pale yellow. Eat foods that are high in fiber. These include beans, whole grains, and fresh fruits and vegetables. Stay away from unripe bananas. Ripe bananas are a good choice. Limit  foods that are high in fat and sugar. These include fried or sweet foods. Avoid dairy products. This includes milk.  General instructions  Keep the butt area clean and dry. Take a warm water  bath as told by your doctor. Do not use soap. Contact a doctor if: You have more bleeding. You have a fever. You have watery poop that is mixed with blood. Your pain does not go away. Your problems get worse. This information is not intended to replace advice given to you by your health care provider. Make sure you discuss any questions you have with your health care provider. Document Revised: 03/06/2022 Document Reviewed: 03/06/2022 Elsevier Patient Education  2024 [employer].
